# Patient Record
Sex: Male | Born: 1935 | Race: White | Hispanic: No | Marital: Married | Smoking: Former smoker
Health system: Southern US, Community
[De-identification: ages and names within clinical notes are randomized; demographics above are authoritative.]

## PROBLEM LIST (undated history)

## (undated) HISTORY — PX: PROSTATE SURGERY: SHX751

## (undated) HISTORY — PX: TONSILLECTOMY: SUR1361

## (undated) HISTORY — PX: INGUINAL HERNIA REPAIR: SUR1180

---

## 2014-05-27 DIAGNOSIS — R339 Retention of urine, unspecified: Secondary | ICD-10-CM | POA: Insufficient documentation

## 2014-05-27 DIAGNOSIS — N401 Enlarged prostate with lower urinary tract symptoms: Secondary | ICD-10-CM | POA: Insufficient documentation

## 2014-06-07 DIAGNOSIS — R339 Retention of urine, unspecified: Secondary | ICD-10-CM | POA: Insufficient documentation

## 2014-09-10 DIAGNOSIS — K4031 Unilateral inguinal hernia, with obstruction, without gangrene, recurrent: Secondary | ICD-10-CM | POA: Insufficient documentation

## 2016-03-30 DIAGNOSIS — H5203 Hypermetropia, bilateral: Secondary | ICD-10-CM | POA: Insufficient documentation

## 2016-03-30 DIAGNOSIS — H524 Presbyopia: Secondary | ICD-10-CM | POA: Insufficient documentation

## 2016-03-30 DIAGNOSIS — N400 Enlarged prostate without lower urinary tract symptoms: Secondary | ICD-10-CM | POA: Insufficient documentation

## 2016-03-30 DIAGNOSIS — K5900 Constipation, unspecified: Secondary | ICD-10-CM | POA: Insufficient documentation

## 2016-03-30 DIAGNOSIS — N3289 Other specified disorders of bladder: Secondary | ICD-10-CM | POA: Insufficient documentation

## 2016-03-30 DIAGNOSIS — M7020 Olecranon bursitis, unspecified elbow: Secondary | ICD-10-CM | POA: Insufficient documentation

## 2016-03-30 DIAGNOSIS — L719 Rosacea, unspecified: Secondary | ICD-10-CM | POA: Insufficient documentation

## 2016-03-30 DIAGNOSIS — H538 Other visual disturbances: Secondary | ICD-10-CM | POA: Insufficient documentation

## 2017-02-03 DIAGNOSIS — H903 Sensorineural hearing loss, bilateral: Secondary | ICD-10-CM | POA: Insufficient documentation

## 2017-06-20 ENCOUNTER — Encounter: Payer: Self-pay | Admitting: Neurology

## 2017-07-13 ENCOUNTER — Encounter: Payer: Self-pay | Admitting: Neurology
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
## 2017-07-18 ENCOUNTER — Ambulatory Visit (INDEPENDENT_AMBULATORY_CARE_PROVIDER_SITE_OTHER): Payer: Medicare Other | Admitting: Neurology

## 2017-07-18 ENCOUNTER — Encounter: Payer: Self-pay | Admitting: Neurology

## 2017-07-18 ENCOUNTER — Encounter: Payer: Self-pay | Admitting: Psychology

## 2017-07-18 VITALS — BP 108/70 | HR 60 | Ht 70.0 in | Wt 157.0 lb

## 2017-07-18 DIAGNOSIS — G20 Parkinson's disease: Secondary | ICD-10-CM

## 2017-07-18 DIAGNOSIS — K117 Disturbances of salivary secretion: Secondary | ICD-10-CM

## 2017-07-18 MED ORDER — CARBIDOPA-LEVODOPA 25-100 MG PO TABS: 1.0000 | ORAL_TABLET | Freq: Three times a day (TID) | ORAL | 3 refills | Status: DC

## 2017-07-18 NOTE — Patient Instructions (Addendum)
1. Start Carbidopa Levodopa as follows:  Take 1/2 tablet three times daily, at least 30 minutes before meals, for one week  Then take 1/2 tablet in the morning, 1/2 tablet in the afternoon, 1 tablet in the evening, at least 30 minutes before meals, for one week  Then take 1/2 tablet in the morning, 1 tablet in the afternoon, 1 tablet in the evening, at least 30 minutes before meals, for one week  Then take 1 tablet three times daily, at least 30 minutes before meals  2. Ruthe Mannanalia Aron, MD can be contacted at [phone].

## 2017-07-18 NOTE — Progress Notes (Signed)
I met with Mr. and Mrs., Ian Mcknight while they were in the office today. We reviewed the Parkinson's resources. He has been a cyclist in the past so the Parkinson cycle at [employer] appeals to him for exercise. I will research medication assistance programs since he does not have Medicare D. I reached out to [facility] to see about a medication assistance program. In addition, I am more than happy to research good RX for the patient as needed and e-mail the coupon to [email or web address].

## 2017-09-21 ENCOUNTER — Telehealth: Payer: Self-pay | Admitting: Psychology

## 2017-09-21 NOTE — Telephone Encounter (Signed)
Telephone call from the patient today to make sure that he is still getting his medication at the cheapest pharmacy.  [facility] good Rx [facility].  I encouraged him to attend the power for Parkinson's support groups as it looks like he and his wife would both enjoy and benefit connecting with other people in similar situations.

## 2017-11-03 NOTE — Progress Notes (Signed)
Ian Mcknight was seen today in the movement disorders clinic for neurologic consultation at the request of Adela GlimpseGold, Catherine Leigh.  This patient is accompanied in the office by his spouse who supplements the history.   The consultation is for the evaluation of cognitive change and dysarthria.  The records that were made available to me were reviewed.  Wife concerned about possible PD.    11/04/17 update: Patient is seen today, accompanied by his wife who supplements the history.  He was diagnosed with Parkinson's disease last visit and started on carbidopa/levodopa 25/100 and work to 1 tablet 3 times per day. Pt thinks that it has been helpful.  [facility] [facility] and "I am thrilled with it."  One fall and he just slipped on mud and fell.  He is drooling much less.  Patient was seen by urology at the beginning of November.  I have reviewed those records.  Cialis was prescribed at that visit for erectile dysfunction. C/o urinary frequency/nocturia of 3-5 times per night.  He is using the urinal.  He is using an OTC bladder supplement.    PREVIOUS MEDICATIONS: none to date  Allergies  Allergen Reactions  . Penicillins   . Sulfa Antibiotics   . Viagra [Sildenafil Citrate]     Current Outpatient Medications on File Prior to Visit  Medication Sig Dispense Refill  . carbidopa-levodopa (SINEMET IR) 25-100 MG tablet Take 1 tablet 3 (three) times daily by mouth. 90 tablet 3  . UNABLE TO FIND Omega Q  Bladder health     No current facility-administered medications on file prior to visit.    History reviewed. No pertinent past medical history.   Past Surgical History:  Procedure Laterality Date  . INGUINAL HERNIA REPAIR Right   . PROSTATE SURGERY    . TONSILLECTOMY      Social History   Socioeconomic History  . Marital status: Married    Spouse name: None  . Number of children: None  . Years of education: None  . Highest education level: None  Social Needs  .  Financial resource strain: None  . Food insecurity - worry: None  . Food insecurity - inability: None  . Transportation needs - medical: None  . Transportation needs - non-medical: None  Occupational History  . Occupation: retired    Comment: builder/printer  Tobacco Use  . Smoking status: Former Smoker    Last attempt to quit: 07/18/1969    Years since quitting: 48.3  . Smokeless tobacco: Never Used  . Tobacco comment: light smoker  Substance and Sexual Activity  . Alcohol use: No    Frequency: Never  . Drug use: No  . Sexual activity: None  Other Topics Concern  . None  Social History Narrative  . None   Family History  Problem Relation Age of Onset  . Dementia Mother   . Angina Father   . Breast cancer Sister   . Heart disease Sister   . Cancer Sister   . Other Child        1 with mental retardation/seizures    ROS:  A complete 10 system review of systems was obtained and was unremarkable apart from what is mentioned above.  PHYSICAL EXAMINATION:    VITALS:   Vitals:   11/04/17 1044  BP: 104/64  Pulse: 62  SpO2: 93%    GEN:  The patient appears stated age and is in NAD. HEENT:  Normocephalic, atraumatic.  The mucous membranes are moist. The superficial temporal arteries are without ropiness or tenderness.  He has copious drooling CV:  RRR Lungs:  CTAB Neck/HEME:  There are no carotid bruits bilaterally.  Neurological examination:  Orientation:  Montreal Cognitive Assessment  07/18/2017  Visuospatial/ Executive (0/5) 4  Naming (0/3) 3  Attention: Read list of digits (0/2) 2  Attention: Read list of letters (0/1) 1  Attention: Serial 7 subtraction starting at 100 (0/3) 3  Language: Repeat phrase (0/2) 2  Language : Fluency (0/1) 0  Abstraction (0/2) 2  Delayed Recall (0/5) 3  Orientation (0/6) 6  Total 26  Adjusted Score (based on education) 26   Cranial nerves: There is good facial symmetry. There is facial hypomimia.   The visual fields are  full to confrontational testing. The speech is fluent and dysarthric. Soft palate rises symmetrically and there is no tongue deviation. Hearing is intact to conversational tone. Sensation: Sensation is intact to light and pinprick throughout (facial, trunk, extremities). Vibration is intact at the bilateral big toe. There is no extinction with double simultaneous stimulation. There is no sensory dermatomal level identified. Motor: Strength is 5/5 in the bilateral upper and lower extremities.   Shoulder shrug is equal and symmetric.  There is no pronator drift.  Movement examination: Tone: There is mild increased tone in the bilateral upper extremities.  The tone in the lower extremities is normal.  Abnormal movements: none Coordination:  There is decremation only with  alternation of supination/pronation of the forearm bilaterally.  All other RAMs are good bilaterally Gait and Station: The patient has no difficulty getting out of the chair.  When asked to walk down the hall, he actually gets up and runs well down the hall.  He has mild decreased arm swing.  Labs: Labs have been reviewed from his primary care provider dated May 20, 2017.  Sodium was 137, potassium 4.4, chloride 101, CO2 30, BUN 19 and creatinine 0.98.  AST 19, ALT 14.  No TSH has been completed and records are available in care everywhere back to 2015.   ASSESSMENT/PLAN:  1. Idiopathic Parkinson's disease, diagnosed at the end of 2018.  -He is doing better on carbidopa/levodopa 25/100 three times per day.  Using good RX for RX coverage.  Risks, benefits, side effects and alternative therapies were discussed.  The opportunity to ask questions was given and they were answered to the best of my ability.  The patient expressed understanding and willingness to follow the outlined treatment protocols.  -Needs TSH today.  -asked many questions about disease progression and answered to best of my ability  2.  Sialorrhea  -This is  commonly associated with PD.  He thinks that this is better since starting carbidopa/levodopa.  3.  Nocturia  -needs an appointment with Dr. Lindley Magnus.  Consider myrbetriq.  Will make him an appointment  3.  Follow up is anticipated in the next few months, sooner should new neurologic issues arise.  Much greater than 50% of this visit was spent in counseling and coordinating care.  Total face to face time:  25 min   Cc:  Adela Glimpse, PA-C

## 2017-11-04 ENCOUNTER — Other Ambulatory Visit (INDEPENDENT_AMBULATORY_CARE_PROVIDER_SITE_OTHER): Payer: Medicare Other

## 2017-11-04 ENCOUNTER — Encounter: Payer: Self-pay | Admitting: Neurology

## 2017-11-04 ENCOUNTER — Ambulatory Visit (INDEPENDENT_AMBULATORY_CARE_PROVIDER_SITE_OTHER): Payer: Medicare Other | Admitting: Neurology

## 2017-11-04 VITALS — BP 104/64 | HR 62 | Ht 70.0 in | Wt 165.0 lb

## 2017-11-04 DIAGNOSIS — G20 Parkinson's disease: Secondary | ICD-10-CM

## 2017-11-04 DIAGNOSIS — R351 Nocturia: Secondary | ICD-10-CM

## 2017-11-04 DIAGNOSIS — K117 Disturbances of salivary secretion: Secondary | ICD-10-CM

## 2017-11-04 DIAGNOSIS — G20A1 Parkinson's disease without dyskinesia, without mention of fluctuations: Secondary | ICD-10-CM

## 2017-11-04 LAB — TSH: TSH: 8.5 u[IU]/mL — ABNORMAL HIGH (ref 0.35–4.50)

## 2017-11-04 MED ORDER — CARBIDOPA-LEVODOPA 25-100 MG PO TABS: 1.0000 | ORAL_TABLET | Freq: Three times a day (TID) | ORAL | 1 refills | Status: DC

## 2017-11-04 NOTE — Addendum Note (Signed)
Addended byMauricio Po: MCCRACKEN, Lesly RubensteinJADE L on: 11/04/2017 12:00 PM   Modules accepted: Orders

## 2017-11-07 ENCOUNTER — Telehealth: Payer: Self-pay | Admitting: Neurology

## 2017-11-07 NOTE — Telephone Encounter (Signed)
Left message on machine for patient to call back.

## 2017-11-07 NOTE — Telephone Encounter (Signed)
[place]re[place] and has access to thyroid lab. Dr. Arbutus Leasat [place] Childes- FYI.

## 2017-11-07 NOTE — Telephone Encounter (Signed)
Pt returned your call.  

## 2017-11-07 NOTE — Telephone Encounter (Signed)
-----   Message from Octaviano Battyebecca S Tat, DO sent at 11/04/2017  4:40 PM EST ----- Let pt know that it appears he has hypothyroidism.  Needs to f/u with PCP.

## 2017-11-07 NOTE — Telephone Encounter (Signed)
Spoke with patient's wife and they were made aware.

## 2017-11-07 NOTE — Telephone Encounter (Signed)
Pt's spouse called and wanted Dr Tat to know pt has am appointment with Dr Farris HasKramer tomorrow for thyroid

## 2017-11-08 ENCOUNTER — Ambulatory Visit (INDEPENDENT_AMBULATORY_CARE_PROVIDER_SITE_OTHER): Payer: Medicare Other | Admitting: Family Medicine

## 2017-11-08 ENCOUNTER — Encounter: Payer: Self-pay | Admitting: Family Medicine

## 2017-11-08 VITALS — BP 104/60 | HR 61 | Ht 70.0 in | Wt 167.1 lb

## 2017-11-08 DIAGNOSIS — I38 Endocarditis, valve unspecified: Secondary | ICD-10-CM

## 2017-11-08 DIAGNOSIS — G20A1 Parkinson's disease without dyskinesia, without mention of fluctuations: Secondary | ICD-10-CM | POA: Insufficient documentation

## 2017-11-08 DIAGNOSIS — E039 Hypothyroidism, unspecified: Secondary | ICD-10-CM

## 2017-11-08 DIAGNOSIS — G20 Parkinson's disease: Secondary | ICD-10-CM

## 2017-11-08 DIAGNOSIS — I493 Ventricular premature depolarization: Secondary | ICD-10-CM

## 2017-11-08 DIAGNOSIS — Z0000 Encounter for general adult medical examination without abnormal findings: Secondary | ICD-10-CM | POA: Insufficient documentation

## 2017-11-08 LAB — COMPREHENSIVE METABOLIC PANEL
ALK PHOS: 67 U/L (ref 39–117)
ALT: 7 U/L (ref 0–53)
AST: 19 U/L (ref 0–37)
Albumin: 4.1 g/dL (ref 3.5–5.2)
BILIRUBIN TOTAL: 0.6 mg/dL (ref 0.2–1.2)
BUN: 19 mg/dL (ref 6–23)
CO2: 32 meq/L (ref 19–32)
CREATININE: 0.99 mg/dL (ref 0.40–1.50)
Calcium: 9.7 mg/dL (ref 8.4–10.5)
Chloride: 101 mEq/L (ref 96–112)
GFR: 76.95 mL/min (ref >60.00)
GLUCOSE: 110 mg/dL — AB (ref 70–99)
Potassium: 4.7 mEq/L (ref 3.5–5.1)
Sodium: 139 mEq/L (ref 135–145)
TOTAL PROTEIN: 6.9 g/dL (ref 6.0–8.3)

## 2017-11-08 LAB — URINALYSIS, ROUTINE W REFLEX MICROSCOPIC
Bilirubin Urine: NEGATIVE
Leukocytes, UA: NEGATIVE
NITRITE: NEGATIVE
Specific Gravity, Urine: 1.025 (ref 1.000–1.030)
TOTAL PROTEIN, URINE-UPE24: NEGATIVE
Urine Glucose: NEGATIVE
Urobilinogen, UA: 0.2 (ref 0.0–1.0)
WBC, UA: NONE SEEN (ref 0–?)
pH: 5.5 (ref 5.0–8.0)

## 2017-11-08 LAB — CBC
HCT: 41.3 % (ref 39.0–52.0)
Hemoglobin: 14.2 g/dL (ref 13.0–17.0)
MCHC: 34.4 g/dL (ref 30.0–36.0)
MCV: 95.3 fl (ref 78.0–100.0)
Platelets: 215 10*3/uL (ref 150.0–400.0)
RBC: 4.33 Mil/uL (ref 4.22–5.81)
RDW: 12.3 % (ref 11.5–15.5)
WBC: 4 10*3/uL (ref 4.0–10.5)

## 2017-11-08 LAB — LIPID PANEL
CHOLESTEROL: 125 mg/dL (ref 0–200)
HDL: 54.1 mg/dL (ref >39.00)
LDL Cholesterol: 62 mg/dL (ref 0–99)
NONHDL: 70.97
TRIGLYCERIDES: 47 mg/dL (ref 0.0–149.0)
Total CHOL/HDL Ratio: 2
VLDL: 9.4 mg/dL (ref 0.0–40.0)

## 2017-11-08 LAB — TSH: TSH: 5.26 u[IU]/mL — ABNORMAL HIGH (ref 0.35–4.50)

## 2017-11-08 MED ORDER — LEVOTHYROXINE SODIUM 25 MCG PO CAPS: 1.0000 | ORAL_CAPSULE | Freq: Every day | ORAL | 1 refills | Status: DC

## 2017-11-08 NOTE — Patient Instructions (Addendum)
Hypothyroidism Hypothyroidism is a disorder of the thyroid. The thyroid is a large gland that is located in the lower front of the neck. The thyroid releases hormones that control how the body works. With hypothyroidism, the thyroid does not make enough of these hormones. What are the causes? Causes of hypothyroidism may include:  Viral infections.  Pregnancy.  Your own defense system (immune system) attacking your thyroid.  Certain medicines.  Birth defects.  Past radiation treatments to your head or neck.  Past treatment with radioactive iodine.  Past surgical removal of part or all of your thyroid.  Problems with the gland that is located in the center of your brain (pituitary).  What are the signs or symptoms? Signs and symptoms of hypothyroidism may include:  Feeling as though you have no energy (lethargy).  Inability to tolerate cold.  Weight gain that is not explained by a change in diet or exercise habits.  Dry skin.  Coarse hair.  Menstrual irregularity.  Slowing of thought processes.  Constipation.  Sadness or depression.  How is this diagnosed? Your health care provider may diagnose hypothyroidism with blood tests and ultrasound tests. How is this treated? Hypothyroidism is treated with medicine that replaces the hormones that your body does not make. After you begin treatment, it may take several weeks for symptoms to go away. Follow these instructions at home:  Take medicines only as directed by your health care provider.  If you start taking any new medicines, tell your health care provider.  Keep all follow-up visits as directed by your health care provider. This is important. As your condition improves, your dosage needs may change. You will need to have blood tests regularly so that your health care provider can watch your condition. Contact a health care provider if:  Your symptoms do not get better with treatment.  You are taking thyroid  replacement medicine and: ? You sweat excessively. ? You have tremors. ? You feel anxious. ? You lose weight rapidly. ? You cannot tolerate heat. ? You have emotional swings. ? You have diarrhea. ? You feel weak. Get help right away if:  You develop chest pain.  You develop an irregular heartbeat.  You develop a rapid heartbeat. This information is not intended to replace advice given to you by your health care provider. Make sure you discuss any questions you have with your health care provider. Document Released: 08/23/2005 Document Revised: 01/29/2016 Document Reviewed: 01/08/2014 Elsevier Interactive Patient Education  2018 [place].  Premature Ventricular Contraction A premature ventricular contraction (PVC) is a common irregularity in the normal heart rhythm. These contractions are extra heartbeats that start in the heart ventricles and occur too early in the normal sequence. During the PVC, the heart's normal electrical pathway is not used, so the beat is shorter and less effective. In most cases, these contractions come and go and do not require treatment. What are the causes? In many cases, the cause may not be known. Common causes of the condition include:  Smoking.  Drinking alcohol.  Caffeine.  Certain medicines.  Some illegal drugs.  Stress.  Certain medical conditions can also cause PVCs:  Changes in minerals in the blood (electrolytes).  Heart failure.  Heart valve problems.  Low blood oxygen levels or high carbon dioxide levels.  Heart attack, or coronary artery disease.  What are the signs or symptoms? The main symptom of this condition is a fast or skipped heartbeat (palpitations). Other symptoms include:  Chest pain.  Shortness  of breath.  Feeling tired.  Dizziness.  In some cases, there are no symptoms. How is this diagnosed? This condition may be diagnosed based on:  Your medical history.  A physical exam. During the exam,  the health care provider will check for irregular heartbeats.  Tests, such as: ? An ECG (electrocardiogram) to monitor the electrical activity of your heart. ? Holter monitor testing. This involves wearing a device that clips to your clothing and monitors the electrical activity of your heart over longer periods of time. ? Stress tests to see how exercise affects your heart rhythm and blood supply. ? Echocardiogram. This test uses sound waves (ultrasound) to produce an image of your heart. ? Electrophysiology study. This test checks the electric pathways in your heart.  How is this treated? Treatment depends on any underlying conditions, the type of PVCs that you are having, and how much the symptoms are interfering with your daily life. Possible treatments include:  Avoiding things that can trigger the premature contractions, such as caffeine or alcohol.  Medicines. These may be given if symptoms are severe or if the extra heartbeats are frequent.  Treatment for any underlying condition that is found to be the cause of the contractions.  Catheter ablation. This procedure destroys the heart tissues that send abnormal signals.  In some cases, no treatment is required. Follow these instructions at home: Lifestyle Follow these instructions as told by your health care provider:  Do not use any products that contain nicotine or tobacco, such as cigarettes and e-cigarettes. If you need help quitting, ask your health care provider.  If caffeine triggers episodes of PVC, do not eat, drink, or use anything with caffeine in it.  If caffeine does not seem to trigger episodes, consume caffeine in moderation.  If alcohol triggers episodes of PVC, do not drink alcohol.  If alcohol does not seem to trigger episodes, limit alcohol intake to no more than 1 drink a day for nonpregnant women and 2 drinks a day for men. One drink equals 12 oz of beer, 5 oz of wine, or 1 oz of hard liquor.  Exercise  regularly. Ask your health care provider what type of exercise is safe for you.  Find healthy ways to manage stress. Avoid stressful situations when possible.  Try to get at least 7-9 hours of sleep each night, or as much as recommended by your health care provider.  Do not use illegal drugs.  General instructions  Take over-the-counter and prescription medicines only as told by your health care provider.  Keep all follow-up visits as told by your health care provider. This is important. Get help right away if:  You feel palpitations that are frequent or continual.  You have chest pain.  You have shortness of breath.  You have sweating for no reason.  You have nausea and vomiting.  You become light-headed or you faint. This information is not intended to replace advice given to you by your health care provider. Make sure you discuss any questions you have with your health care provider. Document Released: 04/09/2004 Document Revised: 04/16/2016 Document Reviewed: 01/28/2016 Elsevier Interactive Patient Education  2018 [place] 65 Years and Older, Male Preventive care refers to lifestyle choices and visits with your health care provider that can promote health and wellness. What does preventive care include?  A yearly physical exam. This is also called an annual well check.  Dental exams once or twice a year.  Routine eye exams.  Ask your health care provider how often you should have your eyes checked.  Personal lifestyle choices, including: ? Daily care of your teeth and gums. ? Regular physical activity. ? Eating a healthy diet. ? Avoiding tobacco and drug use. ? Limiting alcohol use. ? Practicing safe sex. ? Taking low doses of aspirin every day. ? Taking vitamin and mineral supplements as recommended by your health care provider. What happens during an annual well check? The services and screenings done by your health care provider during  your annual well check will depend on your age, overall health, lifestyle risk factors, and family history of disease. Counseling Your health care provider may ask you questions about your:  Alcohol use.  Tobacco use.  Drug use.  Emotional well-being.  Home and relationship well-being.  Sexual activity.  Eating habits.  History of falls.  Memory and ability to understand (cognition).  Work and work Statistician.  Screening You may have the following tests or measurements:  Height, weight, and BMI.  Blood pressure.  Lipid and cholesterol levels. These may be checked every 5 years, or more frequently if you are over 78 years old.  Skin check.  Lung cancer screening. You may have this screening every year starting at age 54 if you have a 30-pack-year history of smoking and currently smoke or have quit within the past 15 years.  Fecal occult blood test (FOBT) of the stool. You may have this test every year starting at age 48.  Flexible sigmoidoscopy or colonoscopy. You may have a sigmoidoscopy every 5 years or a colonoscopy every 10 years starting at age 54.  Prostate cancer screening. Recommendations will vary depending on your family history and other risks.  Hepatitis C blood test.  Hepatitis B blood test.  Sexually transmitted disease (STD) testing.  Diabetes screening. This is done by checking your blood sugar (glucose) after you have not eaten for a while (fasting). You may have this done every 1-3 years.  Abdominal aortic aneurysm (AAA) screening. You may need this if you are a current or former smoker.  Osteoporosis. You may be screened starting at age 31 if you are at high risk.  Talk with your health care provider about your test results, treatment options, and if necessary, the need for more tests. Vaccines Your health care provider may recommend certain vaccines, such as:  Influenza vaccine. This is recommended every year.  Tetanus, diphtheria, and  acellular pertussis (Tdap, Td) vaccine. You may need a Td booster every 10 years.  Varicella vaccine. You may need this if you have not been vaccinated.  Zoster vaccine. You may need this after age 80.  Measles, mumps, and rubella (MMR) vaccine. You may need at least one dose of MMR if you were born in 1957 or later. You may also need a second dose.  Pneumococcal 13-valent conjugate (PCV13) vaccine. One dose is recommended after age 13.  Pneumococcal polysaccharide (PPSV23) vaccine. One dose is recommended after age 65.  Meningococcal vaccine. You may need this if you have certain conditions.  Hepatitis A vaccine. You may need this if you have certain conditions or if you travel or work in places where you may be exposed to hepatitis A.  Hepatitis B vaccine. You may need this if you have certain conditions or if you travel or work in places where you may be exposed to hepatitis B.  Haemophilus influenzae type b (Hib) vaccine. You may need this if you have certain risk factors.  Talk to  your health care provider about which screenings and vaccines you need and how often you need them. This information is not intended to replace advice given to you by your health care provider. Make sure you discuss any questions you have with your health care provider. Document Released: 09/19/2015 Document Revised: 05/12/2016 Document Reviewed: 06/24/2015 Elsevier Interactive Patient Education  2018 [employer]. Levothyroxine oral capsules What is this medicine? LEVOTHYROXINE (lee voe thye ROX een) is a thyroid hormone. This medicine can improve symptoms of thyroid deficiency such as slow speech, lack of energy, weight gain, hair loss, dry skin, and feeling cold. It also helps to treat goiter (an enlarged thyroid gland). It is also used to treat some kinds of thyroid cancer along with surgery and other medicines. This medicine may be used for other purposes; ask your health care provider or pharmacist if  you have questions. COMMON BRAND NAME(S): Tirosint What should I tell my health care provider before I take this medicine? They need to know if you have any of these conditions: -angina -blood clotting problems -diabetes -dieting or on a weight loss program -fertility problems -heart disease -high levels of thyroid hormone -pituitary gland problem -previous heart attack -an unusual or allergic reaction to levothyroxine, thyroid hormones, other medicines, foods, dyes, or preservatives -pregnant or trying to get pregnant -breast-feeding How should I use this medicine? Take this medicine by mouth with a glass of water. It is best to take on an empty stomach, at least 30 minutes to one hour before breakfast. Avoid taking antacids containing aluminum or magnesium, simethicone, bile acid sequestrants, calcium carbonate, sodium polystyrene sulfonate, ferrous sulfate, and sucralfate within 4 hours of taking this medicine. Do not cut, crush or chew this medicine. Follow the directions on the prescription label. Take at the same time each day. Do not take your medicine more often than directed. Talk to your pediatrician regarding the use of this medicine in children. While this drug may be prescribed for selected conditions, precautions do apply. Since the capsules cannot be crushed or placed in water, they may only be given to infants and children who are able to swallow an intact capsule. Overdosage: If you think you have taken too much of this medicine contact a poison control center or emergency room at once. NOTE: This medicine is only for you. Do not share this medicine with others. What if I miss a dose? If you miss a dose, take it as soon as you can. If it is almost time for your next dose, take only that dose. Do not take double or extra doses. What may interact with this medicine? -amiodarone -antacids -anti-thyroid medicines -calcium  supplements -carbamazepine -cholestyramine -colestipol -digoxin -male hormones, including contraceptive or birth control pills -iron supplements -ketamine -liquid nutrition products like Ensure -medicines for colds and breathing difficulties -medicines for diabetes -medicines for mental depression -medicines or herbals used to decrease weight or appetite -phenobarbital or other barbiturate medications -phenytoin -prednisone or other corticosteroids -rifabutin -rifampin -simethicone -sodium polystyrene sulfonate -soy isoflavones -sucralfate -theophylline -warfarin This list may not describe all possible interactions. Give your health care provider a list of all the medicines, herbs, non-prescription drugs, or dietary supplements you use. Also tell them if you smoke, drink alcohol, or use illegal drugs. Some items may interact with your medicine. What should I watch for while using this medicine? Do not switch brands of this medicine unless your health care professional agrees with the change. Ask questions if you are uncertain. You will  need regular exams and occasional blood tests to check the response to treatment. If you are receiving this medicine for an underactive thyroid, it may be several weeks before you notice an improvement. Check with your doctor or health care professional if your symptoms do not improve. It may be necessary for you to take this medicine for the rest of your life. Do not stop using this medicine unless your doctor or health care professional advises you to. This medicine can affect blood sugar levels. If you have diabetes, check your blood sugar as directed. You may lose some of your hair when you first start treatment. With time, this usually corrects itself. If you are going to have surgery, tell your doctor or health care professional that you are taking this medicine. What side effects may I notice from receiving this medicine? Side effects that you  should report to your doctor or health care professional as soon as possible: -allergic reactions like skin rash, itching or hives, swelling of the face, lips, or tongue -chest pain -excessive sweating or intolerance to heat -fast or irregular heartbeat -nervousness -swelling of ankles, feet, or legs -tremors Side effects that usually do not require medical attention (report to your doctor or health care professional if they continue or are bothersome): -changes in appetite -changes in menstrual periods -diarrhea -hair loss -headache -trouble sleeping -weight loss This list may not describe all possible side effects. Call your doctor for medical advice about side effects. You may report side effects to FDA at 1-800-FDA-1088. Where should I keep my medicine? Keep out of the reach of children. Store at room temperature between 15 and 30 degrees C (59 and 86 degrees F). Protect from light and moisture. Keep container tightly closed. Throw away any unused medicine after the expiration date. NOTE: This sheet is a summary. It may not cover all possible information. If you have questions about this medicine, talk to your doctor, pharmacist, or health care provider.  2018 Elsevier/Gold Standard (2015-09-25 09:44:55)

## 2017-11-08 NOTE — Progress Notes (Signed)
Subjective:  Patient ID: Ian Mcknight, male    DOB: 10/03/1933  Age: 84 y.o. MRN: [id number]  CC: Establish Care   HPI Ian Mcknight presents for establishment of care and for follow-up of an elevated TSH of by his neurologist.  He is currently being treated for Parkinson's with Sinemet.  He was diagnosed in November and started the Sinemet at that time.  His neurologist measured her TSH and found to be elevated.  He has some problems with constipation but denies cold intolerance hair loss or decreased energy levels.  If anything he has trouble gaining weight.  He is currently dealing with BPH symptoms that have been treated with laser ablation.  Symptoms  worse somewhat improved but has now worsened with decreased urine flow and straining to urinate.  He is planning on following up with his he has no chest pain or shortness of breath.  Urologist in the near future.  He has no known history of heart arrhythmia or murmurs in his heart.  He is never had a heart attack to his knowledge.  He continues to be active.  [place] [employer] [place] and walks almost daily.  He lives with his wife.  They have 3 grown children.  His daughter is mentally challenged and living in the [place] area on her own.  He does not smoke drink alcohol or use illicit drugs.  History Ian Mcknight has no past medical history on file.   He has a past surgical history that includes Tonsillectomy; Prostate surgery; and Inguinal hernia repair (Right).   His family history includes Angina in his father; Breast cancer in his sister; Cancer in his sister; Dementia in his mother; Heart disease in his sister; Other in his child.He reports that he quit smoking about 48 years ago. he has never used smokeless tobacco. He reports that he does not drink alcohol or use drugs.  Outpatient Medications Prior to Visit  Medication Sig Dispense Refill  . carbidopa-levodopa (SINEMET IR) 25-100 MG tablet Take 1 tablet by mouth 3 (three) times daily. 270  tablet 1  . UNABLE TO FIND Omega Q  Bladder health     No facility-administered medications prior to visit.     ROS Review of Systems  Constitutional: Negative.  Negative for diaphoresis, fatigue, fever and unexpected weight change.  HENT: Negative.   Eyes: Negative.   Respiratory: Negative for shortness of breath.   Cardiovascular: Negative for chest pain and palpitations.  Gastrointestinal: Negative.   Endocrine: Negative for cold intolerance and heat intolerance.  Genitourinary: Positive for difficulty urinating.  Skin: Negative for pallor and wound.  Allergic/Immunologic: Negative for immunocompromised state.  Neurological: Positive for tremors.  Hematological: Does not bruise/bleed easily.  Psychiatric/Behavioral: Negative.     Objective:  BP 104/60 (BP Location: Left Arm, Patient Position: Sitting, Cuff Size: Normal)   Pulse 61   Ht 5\' 10"  (1.778 m)   Wt 167 lb 2 oz (75.8 kg)   SpO2 99%   BMI 23.98 kg/m   Physical Exam  Constitutional: He is oriented to person, place, and time. He appears well-developed and well-nourished. No distress.  HENT:  Head: Normocephalic and atraumatic.  Right Ear: External ear normal.  Left Ear: External ear normal.  Mouth/Throat: Oropharynx is clear and moist. No oropharyngeal exudate.  Eyes: Pupils are equal, round, and reactive to light. Right eye exhibits no discharge. Left eye exhibits no discharge. No scleral icterus.  Neck: Neck supple. No JVD present. No tracheal  deviation present. No thyromegaly present.  Cardiovascular: Normal rate. Frequent extrasystoles are present.  Murmur heard.  Systolic murmur is present with a grade of 2/6.  Diastolic murmur is present with a grade of 1/6. Pulmonary/Chest: Effort normal and breath sounds normal. No stridor.  Abdominal: Bowel sounds are normal.  Lymphadenopathy:    He has no cervical adenopathy.  Neurological: He is alert and oriented to person, place, and time.  Skin: Skin is warm  and dry. He is not diaphoretic.  Psychiatric: He has a normal mood and affect. His behavior is normal.      Assessment & Plan:   Ian Mcknight was seen today for establish care.  Diagnoses and all orders for this visit:  Parkinson's disease (HCC)  Hypothyroidism, unspecified type -     TSH -     Lipid panel -     Levothyroxine Sodium 25 MCG CAPS; Take 1 capsule (25 mcg total) by mouth daily before breakfast. One hour before eating  Murmur, diastolic -     TSH -     Lipid panel -     Ambulatory referral to Cardiology  Healthcare maintenance -     CBC -     Comprehensive metabolic panel -     Urinalysis, Routine w reflex microscopic  PVC (premature ventricular contraction) -     CBC -     Comprehensive metabolic panel -     TSH -     Urinalysis, Routine w reflex microscopic -     EKG 12-Lead -     Ambulatory referral to Cardiology   I am having Ian Mcknight start on Levothyroxine Sodium. I am also having him maintain his UNABLE TO FIND and carbidopa-levodopa.  Meds ordered this encounter  Medications  . Levothyroxine Sodium 25 MCG CAPS    Sig: Take 1 capsule (25 mcg total) by mouth daily before breakfast. One hour before eating    Dispense:  30 capsule    Refill:  1   Stressed the importance of taking medicine daily and hour before eating.  Follow-up: Return in about 8 weeks (around 01/03/2018).  Mliss Sax, MD

## 2017-11-09 ENCOUNTER — Other Ambulatory Visit: Payer: Self-pay

## 2017-11-09 DIAGNOSIS — E039 Hypothyroidism, unspecified: Secondary | ICD-10-CM

## 2017-11-09 MED ORDER — LEVOTHYROXINE SODIUM 25 MCG PO CAPS: 1.0000 | ORAL_CAPSULE | Freq: Every day | ORAL | 1 refills | Status: DC

## 2017-11-10 ENCOUNTER — Other Ambulatory Visit: Payer: Self-pay

## 2017-11-10 MED ORDER — LEVOTHYROXINE SODIUM 25 MCG PO TABS: 25.0000 ug | ORAL_TABLET | Freq: Every day | ORAL | 1 refills | Status: DC

## 2017-11-24 ENCOUNTER — Ambulatory Visit (INDEPENDENT_AMBULATORY_CARE_PROVIDER_SITE_OTHER): Payer: Medicare Other | Admitting: Cardiovascular Disease

## 2017-11-24 ENCOUNTER — Encounter: Payer: Self-pay | Admitting: Cardiovascular Disease

## 2017-11-24 VITALS — BP 100/70 | HR 75 | Ht 70.0 in | Wt 168.0 lb

## 2017-11-24 DIAGNOSIS — R011 Cardiac murmur, unspecified: Secondary | ICD-10-CM | POA: Insufficient documentation

## 2017-11-24 NOTE — Progress Notes (Signed)
11/24/2017 Ian Carrowvey Streety   12/09/1933  [id number]  Primary Physician Mliss SaxKremer, William Alfred, MD Primary Cardiologist: Runell GessJonathan J Johney Perotti MD Nicholes CalamityFACP, FACC, FAHA, [place]  HPI:  Ian Mcknight is a 84 y.o. thin-appearing married Caucasian male father of 3 children, grandfather of 2 grandchildren who is accompanied by his wife Ian Mcknight today. He was referred by Dr. Doreene BurkeKremer, his new PCP, because of that auscultated cardiac murmur. He does have a history of Parkinson's disease as well as PVCs. He has no other risk factors. He denies chest pain or shortness of breath. He has never had a heart attack or stroke. His father did have a myocardial infarction and sister 7 years younger than him had bypass surgery.    Current Meds  Medication Sig  . carbidopa-levodopa (SINEMET IR) 25-100 MG tablet Take 1 tablet by mouth 3 (three) times daily.  Marland Kitchen. levothyroxine (SYNTHROID, LEVOTHROID) 25 MCG tablet Take 1 tablet (25 mcg total) by mouth daily before breakfast.  . UNABLE TO FIND Omega Q  Bladder health     Allergies  Allergen Reactions  . Penicillins   . Sulfa Antibiotics   . Viagra [Sildenafil Citrate]     Social History   Socioeconomic History  . Marital status: Married    Spouse name: Not on file  . Number of children: Not on file  . Years of education: Not on file  . Highest education level: Not on file  Occupational History  . Occupation: retired    Comment: Print production plannerbuilder/printer  Social Needs  . Financial resource strain: Not on file  . Food insecurity:    Worry: Not on file    Inability: Not on file  . Transportation needs:    Medical: Not on file    Non-medical: Not on file  Tobacco Use  . Smoking status: Former Smoker    Last attempt to quit: 07/18/1969    Years since quitting: 48.3  . Smokeless tobacco: Never Used  . Tobacco comment: light smoker  Substance and Sexual Activity  . Alcohol use: No    Frequency: Never  . Drug use: No  . Sexual activity: Not on file  Lifestyle  . Physical  activity:    Days per week: Not on file    Minutes per session: Not on file  . Stress: Not on file  Relationships  . Social connections:    Talks on phone: Not on file    Gets together: Not on file    Attends religious service: Not on file    Active member of club or organization: Not on file    Attends meetings of clubs or organizations: Not on file    Relationship status: Not on file  . Intimate partner violence:    Fear of current or ex partner: Not on file    Emotionally abused: Not on file    Physically abused: Not on file    Forced sexual activity: Not on file  Other Topics Concern  . Not on file  Social History Narrative  . Not on file     Review of Systems: General: negative for chills, fever, night sweats or weight changes.  Cardiovascular: negative for chest pain, dyspnea on exertion, edema, orthopnea, palpitations, paroxysmal nocturnal dyspnea or shortness of breath Dermatological: negative for rash Respiratory: negative for cough or wheezing Urologic: negative for hematuria Abdominal: negative for nausea, vomiting, diarrhea, bright red blood per rectum, melena, or hematemesis Neurologic: negative for visual changes, syncope, or dizziness All other systems reviewed  and are otherwise negative except as noted above.    Blood pressure 100/70, pulse 75, height 5\' 10"  (1.778 m), weight 168 lb (76.2 kg).  General appearance: alert and no distress Neck: no adenopathy, no carotid bruit, no JVD, supple, symmetrical, trachea midline and thyroid not enlarged, symmetric, no tenderness/mass/nodules Lungs: clear to auscultation bilaterally Heart: soft apical systolic murmur consistent with mitral regurgitation. Extremities: extremities normal, atraumatic, no cyanosis or edema Pulses: 2+ and symmetric Skin: Skin color, texture, turgor normal. No rashes or lesions Neurologic: Alert and oriented X 3, normal strength and tone. Normal symmetric reflexes. Normal coordination and  gait  EKG sinus rhythm at 75 with sinus arrhythmia and PACs with evidence of LVH. I personally reviewed this EKG.  ASSESSMENT AND PLAN:   Cardiac murmur Mr. Mcknight was referred by Dr. Doreene Burke because of auscultated cardiac murmur. On exam he has a soft apical systolic murmur consistent with mitral regurgitation. I'm going to get a 2-D echo to further evaluate.      Runell Gess MD FACP,FACC,FAHA, [facility] 11/24/2017 11:35 AM

## 2017-11-24 NOTE — Assessment & Plan Note (Signed)
[name] was referred by Dr. Doreene BurkeKremer because of auscultated cardiac murmur. On exam he has a soft apical systolic murmur consistent with mitral regurgitation. I'm going to get a 2-D echo to further evaluate.

## 2017-11-24 NOTE — Patient Instructions (Signed)

## 2017-11-29 ENCOUNTER — Ambulatory Visit: Payer: PRIVATE HEALTH INSURANCE | Admitting: Physician Assistant

## 2017-11-30 ENCOUNTER — Ambulatory Visit: Payer: Medicare Other | Admitting: Cardiovascular Disease

## 2017-12-01 ENCOUNTER — Other Ambulatory Visit: Payer: Self-pay

## 2017-12-01 ENCOUNTER — Ambulatory Visit (HOSPITAL_COMMUNITY): Payer: Medicare Other | Attending: Cardiology

## 2017-12-01 DIAGNOSIS — Z87891 Personal history of nicotine dependence: Secondary | ICD-10-CM | POA: Insufficient documentation

## 2017-12-01 DIAGNOSIS — Z8249 Family history of ischemic heart disease and other diseases of the circulatory system: Secondary | ICD-10-CM | POA: Diagnosis not present

## 2017-12-01 DIAGNOSIS — I083 Combined rheumatic disorders of mitral, aortic and tricuspid valves: Secondary | ICD-10-CM | POA: Diagnosis not present

## 2017-12-01 DIAGNOSIS — R011 Cardiac murmur, unspecified: Secondary | ICD-10-CM | POA: Diagnosis present

## 2017-12-09 ENCOUNTER — Other Ambulatory Visit: Payer: Self-pay

## 2017-12-09 MED ORDER — LEVOTHYROXINE SODIUM 25 MCG PO TABS: 25.0000 ug | ORAL_TABLET | Freq: Every day | ORAL | 1 refills | Status: DC

## 2018-01-03 ENCOUNTER — Ambulatory Visit (INDEPENDENT_AMBULATORY_CARE_PROVIDER_SITE_OTHER): Payer: Medicare Other | Admitting: Family Medicine

## 2018-01-03 ENCOUNTER — Encounter: Payer: Self-pay | Admitting: Family Medicine

## 2018-01-03 VITALS — BP 128/78 | HR 85 | Ht 70.0 in | Wt 165.2 lb

## 2018-01-03 DIAGNOSIS — E039 Hypothyroidism, unspecified: Secondary | ICD-10-CM | POA: Diagnosis not present

## 2018-01-03 NOTE — Progress Notes (Addendum)
Subjective:  Patient ID: Ian Mcknight, male    DOB: [birth year]  Age: 84 y.o. MRN: [id number]  CC: Follow-up   HPI Ewell Benassi presents for follow-up of his hypothyroidism.  He has been taking his Synthroid as directed.  On occasion it does bother his stomach somewhat taking it an hour before his morning meal.  He was concerned about taking it at the same time as his Sinemet I said that would be okay to do it should not be a problem with that.  I was over 45 minutes late getting in to see him and he had another appointment in 10 minutes and needed to leave.  I told him that I would order his TSH today and he can come back have it drawn at his convenience.  Outpatient Medications Prior to Visit  Medication Sig Dispense Refill  . carbidopa-levodopa (SINEMET IR) 25-100 MG tablet Take 1 tablet by mouth 3 (three) times daily. 270 tablet 1  . UNABLE TO FIND Omega Q  Bladder health    . levothyroxine (SYNTHROID, LEVOTHROID) 25 MCG tablet Take 1 tablet (25 mcg total) by mouth daily before breakfast. 90 tablet 1   No facility-administered medications prior to visit.     ROS Review of Systems  Constitutional: Negative.  Negative for fatigue and unexpected weight change.  Endocrine: Negative for cold intolerance and heat intolerance.  Skin: Negative.   Psychiatric/Behavioral: Negative.     Objective:  BP 128/78   Pulse 85   Ht  (1.778 m)   Wt 165 lb 4 oz (75 kg)   SpO2 99%   BMI 23.71 kg/m   BP Readings from Last 3 Encounters:  01/03/18 128/78  11/24/17 100/70  11/08/17 104/60    Wt Readings from Last 3 Encounters:  01/03/18 165 lb 4 oz (75 kg)  11/24/17 168 lb (76.2 kg)  11/08/17 167 lb 2 oz (75.8 kg)    Physical Exam  Constitutional: He is oriented to person, place, and time. He appears well-developed and well-nourished. No distress.  HENT:  Head: Normocephalic and atraumatic.  Right Ear: External ear normal.  Left Ear: External ear normal.  Eyes: Right eye exhibits no  discharge. Left eye exhibits no discharge. No scleral icterus.  Pulmonary/Chest: Effort normal.  Neurological: He is alert and oriented to person, place, and time.  Skin: He is not diaphoretic.  Psychiatric: He has a normal mood and affect. His behavior is normal.    Lab Results  Component Value Date   WBC 4.0 11/08/2017   HGB 14.2 11/08/2017   HCT 41.3 11/08/2017   PLT 215.0 11/08/2017   GLUCOSE 110 (H) 11/08/2017   CHOL 125 11/08/2017   TRIG 47.0 11/08/2017   HDL 54.10 11/08/2017   LDLCALC 62 11/08/2017   ALT 7 11/08/2017   AST 19 11/08/2017   NA 139 11/08/2017   K 4.7 11/08/2017   CL 101 11/08/2017   CREATININE 0.99 11/08/2017   BUN 19 11/08/2017   CO2 32 11/08/2017   TSH 5.80 (H) 01/04/2018    Patient was never admitted.  Assessment & Plan:   Marv was seen today for follow-up.  Diagnoses and all orders for this visit:  Hypothyroidism, unspecified type -     TSH; Future -     Discontinue: levothyroxine (SYNTHROID, LEVOTHROID) 25 MCG tablet; Take 1 tablet (25 mcg total) by mouth daily before breakfast. -     levothyroxine (SYNTHROID, LEVOTHROID) 50 MCG tablet; Take 1 tablet (50 mcg total) by  mouth daily.   I have discontinued TRUE Kielty's levothyroxine and levothyroxine. I am also having him start on levothyroxine. Additionally, I am having him maintain his UNABLE TO FIND and carbidopa-levodopa.  Meds ordered this encounter  Medications  . DISCONTD: levothyroxine (SYNTHROID, LEVOTHROID) 25 MCG tablet    Sig: Take 1 tablet (25 mcg total) by mouth daily before breakfast.    Dispense:  90 tablet    Refill:  1  . levothyroxine (SYNTHROID, LEVOTHROID) 50 MCG tablet    Sig: Take 1 tablet (50 mcg total) by mouth daily.    Dispense:  90 tablet    Refill:  3   Follow-up: No follow-ups on file.  Mliss Sax, MD

## 2018-01-04 ENCOUNTER — Other Ambulatory Visit (INDEPENDENT_AMBULATORY_CARE_PROVIDER_SITE_OTHER): Payer: Medicare Other

## 2018-01-04 DIAGNOSIS — E039 Hypothyroidism, unspecified: Secondary | ICD-10-CM

## 2018-01-04 LAB — TSH: TSH: 5.8 u[IU]/mL — AB (ref 0.35–4.50)

## 2018-01-05 MED ORDER — LEVOTHYROXINE SODIUM 50 MCG PO TABS: 50.0000 ug | ORAL_TABLET | Freq: Every day | ORAL | 3 refills | Status: DC

## 2018-01-05 MED ORDER — LEVOTHYROXINE SODIUM 25 MCG PO TABS: 25.0000 ug | ORAL_TABLET | Freq: Every day | ORAL | 1 refills | Status: DC

## 2018-01-05 NOTE — Addendum Note (Signed)
Addended by: Andrez Grime on: 01/05/2018 10:32 AM   Modules accepted: Orders

## 2018-01-06 ENCOUNTER — Ambulatory Visit (INDEPENDENT_AMBULATORY_CARE_PROVIDER_SITE_OTHER): Payer: Medicare Other | Admitting: Family Medicine

## 2018-01-06 ENCOUNTER — Encounter: Payer: Self-pay | Admitting: Family Medicine

## 2018-01-06 VITALS — BP 128/78 | HR 64 | Temp 98.5°F | Ht 70.0 in | Wt 165.0 lb

## 2018-01-06 DIAGNOSIS — E039 Hypothyroidism, unspecified: Secondary | ICD-10-CM

## 2018-01-06 NOTE — Progress Notes (Signed)
Subjective:  Patient ID: Ian Mcknight, male    DOB: April 22, 1934  Age: 84 y.o. MRN: [id number]  CC: Thyroid Problem (discuss thyroid medication)   HPI Ian Mcknight presents for a discussion of why his TSH had increased under treatment for with Synthroid for hypothyroidism.  He reminds me that the pharmacy changed manufactures for his medicine.  He assures me that he has been taking it every morning on a fasting stomach 1 hour prior to eating.  He remains quite active and walks on his treadmill 3 times a week.  He denies cold sensitivity or hair loss.  He does have some issue with constipation that is responsive to an apple with a improvements.  Outpatient Medications Prior to Visit  Medication Sig Dispense Refill  . carbidopa-levodopa (SINEMET IR) 25-100 MG tablet Take 1 tablet by mouth 3 (three) times daily. 270 tablet 1  . UNABLE TO FIND Omega Q  Bladder health    . levothyroxine (SYNTHROID, LEVOTHROID) 50 MCG tablet Take 1 tablet (50 mcg total) by mouth daily. 90 tablet 3   No facility-administered medications prior to visit.     ROS Review of Systems  Constitutional: Negative for fatigue and unexpected weight change.  Gastrointestinal: Positive for constipation.  Endocrine: Negative for cold intolerance and heat intolerance.  Psychiatric/Behavioral: Negative.     Objective:  BP 128/78 (BP Location: Left Arm, Patient Position: Sitting, Cuff Size: Normal)   Pulse 64   Temp 98.5 F (36.9 C) (Oral)   Ht  (1.778 m)   Wt 165 lb (74.8 kg)   SpO2 96%   BMI 23.68 kg/m   BP Readings from Last 3 Encounters:  01/06/18 128/78  01/03/18 128/78  11/24/17 100/70    Wt Readings from Last 3 Encounters:  01/06/18 165 lb (74.8 kg)  01/03/18 165 lb 4 oz (75 kg)  11/24/17 168 lb (76.2 kg)    Physical Exam  Constitutional: He is oriented to person, place, and time. He appears well-developed and well-nourished. No distress.  HENT:  Head: Normocephalic and atraumatic.  Right Ear:  External ear normal.  Left Ear: External ear normal.  Eyes: Right eye exhibits no discharge. Left eye exhibits no discharge.  Pulmonary/Chest: Effort normal.  Neurological: He is alert and oriented to person, place, and time.  Skin: He is not diaphoretic.  Psychiatric: He has a normal mood and affect. His behavior is normal.    Lab Results  Component Value Date   WBC 4.0 11/08/2017   HGB 14.2 11/08/2017   HCT 41.3 11/08/2017   PLT 215.0 11/08/2017   GLUCOSE 110 (H) 11/08/2017   CHOL 125 11/08/2017   TRIG 47.0 11/08/2017   HDL 54.10 11/08/2017   LDLCALC 62 11/08/2017   ALT 7 11/08/2017   AST 19 11/08/2017   NA 139 11/08/2017   K 4.7 11/08/2017   CL 101 11/08/2017   CREATININE 0.99 11/08/2017   BUN 19 11/08/2017   CO2 32 11/08/2017   TSH 5.80 (H) 01/04/2018    Patient was never admitted.  Assessment & Plan:   Ian Mcknight was seen today for thyroid problem.  Diagnoses and all orders for this visit:  Hypothyroidism, unspecified type   I have discontinued Ian Mcknight's levothyroxine. I am also having him maintain his UNABLE TO FIND and carbidopa-levodopa.  No orders of the defined types were placed in this encounter.  Patient would like to discontinue his thyroid medicine for another 6 weeks and recheck the TSH.  I told him that  we would have to start all over again with a low dose Synthroid and he understands.   Follow-up: Return in about 6 weeks (around 02/17/2018).  Mliss Sax, MD

## 2018-01-06 NOTE — Patient Instructions (Signed)
Hypothyroidism Hypothyroidism is a disorder of the thyroid. The thyroid is a large gland that is located in the lower front of the neck. The thyroid releases hormones that control how the body works. With hypothyroidism, the thyroid does not make enough of these hormones. What are the causes? Causes of hypothyroidism may include:  Viral infections.  Pregnancy.  Your own defense system (immune system) attacking your thyroid.  Certain medicines.  Birth defects.  Past radiation treatments to your head or neck.  Past treatment with radioactive iodine.  Past surgical removal of part or all of your thyroid.  Problems with the gland that is located in the center of your brain (pituitary).  What are the signs or symptoms? Signs and symptoms of hypothyroidism may include:  Feeling as though you have no energy (lethargy).  Inability to tolerate cold.  Weight gain that is not explained by a change in diet or exercise habits.  Dry skin.  Coarse hair.  Menstrual irregularity.  Slowing of thought processes.  Constipation.  Sadness or depression.  How is this diagnosed? Your health care provider may diagnose hypothyroidism with blood tests and ultrasound tests. How is this treated? Hypothyroidism is treated with medicine that replaces the hormones that your body does not make. After you begin treatment, it may take several weeks for symptoms to go away. Follow these instructions at home:  Take medicines only as directed by your health care provider.  If you start taking any new medicines, tell your health care provider.  Keep all follow-up visits as directed by your health care provider. This is important. As your condition improves, your dosage needs may change. You will need to have blood tests regularly so that your health care provider can watch your condition. Contact a health care provider if:  Your symptoms do not get better with treatment.  You are taking thyroid  replacement medicine and: ? You sweat excessively. ? You have tremors. ? You feel anxious. ? You lose weight rapidly. ? You cannot tolerate heat. ? You have emotional swings. ? You have diarrhea. ? You feel weak. Get help right away if:  You develop chest pain.  You develop an irregular heartbeat.  You develop a rapid heartbeat. This information is not intended to replace advice given to you by your health care provider. Make sure you discuss any questions you have with your health care provider. Document Released: 08/23/2005 Document Revised: 01/29/2016 Document Reviewed: 01/08/2014 Elsevier Interactive Patient Education  2018 Elsevier Inc.  

## 2018-02-17 ENCOUNTER — Ambulatory Visit (INDEPENDENT_AMBULATORY_CARE_PROVIDER_SITE_OTHER): Payer: Medicare Other | Admitting: Family Medicine

## 2018-02-17 ENCOUNTER — Encounter: Payer: Self-pay | Admitting: Family Medicine

## 2018-02-17 VITALS — BP 120/80 | HR 70 | Temp 98.1°F | Ht 70.0 in | Wt 165.0 lb

## 2018-02-17 DIAGNOSIS — T887XXA Unspecified adverse effect of drug or medicament, initial encounter: Secondary | ICD-10-CM | POA: Insufficient documentation

## 2018-02-17 DIAGNOSIS — E039 Hypothyroidism, unspecified: Secondary | ICD-10-CM | POA: Diagnosis not present

## 2018-02-17 LAB — TSH: TSH: 6.81 u[IU]/mL — ABNORMAL HIGH (ref 0.35–4.50)

## 2018-02-17 NOTE — Progress Notes (Addendum)
Subjective:  Patient ID: Ian Mcknight, male    DOB: [birth year]  Age: 84 y.o. MRN: [id number]  CC: Follow-up   HPI Ian Mcknight presents for follow-up status post temporary discontinuation of levothyroxine.  He felt as though taking the medicine each morning on a fasting stomach was causing stomach upset.  He says that he no longer has the stomach upset status post discontinuation of the medicine.  He does not feel like there is been a change in his energy levels.  He denies cold sensitivity or constipation.  He has been supplementing with an over the counter thyroid supplement.  He continues to exercise.  Outpatient Medications Prior to Visit  Medication Sig Dispense Refill  . carbidopa-levodopa (SINEMET IR) 25-100 MG tablet Take 1 tablet by mouth 3 (three) times daily. 270 tablet 1  . UNABLE TO FIND Omega Q  Bladder health     No facility-administered medications prior to visit.     ROS Review of Systems  Constitutional: Negative.  Negative for chills, fatigue and unexpected weight change.  Respiratory: Negative.   Cardiovascular: Negative.   Gastrointestinal: Negative.   Endocrine: Negative for cold intolerance and heat intolerance.  Neurological: Negative for weakness.  Psychiatric/Behavioral: Negative.     Objective:  BP 120/80   Pulse 70   Temp 98.1 F (36.7 C)   Ht 5\' 10"  (1.778 m)   Wt 165 lb (74.8 kg)   SpO2 97%   BMI 23.68 kg/m   BP Readings from Last 3 Encounters:  02/17/18 120/80  01/06/18 128/78  01/03/18 128/78    Wt Readings from Last 3 Encounters:  02/17/18 165 lb (74.8 kg)  01/06/18 165 lb (74.8 kg)  01/03/18 165 lb 4 oz (75 kg)    Physical Exam  Constitutional: He is oriented to person, place, and time. He appears well-developed and well-nourished. No distress.  HENT:  Head: Normocephalic and atraumatic.  Right Ear: External ear normal.  Left Ear: External ear normal.  Eyes: Right eye exhibits no discharge. Left eye exhibits no discharge. No  scleral icterus.  Pulmonary/Chest: Effort normal.  Neurological: He is alert and oriented to person, place, and time.  Skin: He is not diaphoretic.  Psychiatric: He has a normal mood and affect. His behavior is normal.    Lab Results  Component Value Date   WBC 4.0 11/08/2017   HGB 14.2 11/08/2017   HCT 41.3 11/08/2017   PLT 215.0 11/08/2017   GLUCOSE 110 (H) 11/08/2017   CHOL 125 11/08/2017   TRIG 47.0 11/08/2017   HDL 54.10 11/08/2017   LDLCALC 62 11/08/2017   ALT 7 11/08/2017   AST 19 11/08/2017   NA 139 11/08/2017   K 4.7 11/08/2017   CL 101 11/08/2017   CREATININE 0.99 11/08/2017   BUN 19 11/08/2017   CO2 32 11/08/2017   TSH 6.81 (H) 02/17/2018    Patient was never admitted.  Assessment & Plan:   Ian Mcknight was seen today for follow-up.  Diagnoses and all orders for this visit:  Hypothyroidism, unspecified type -     TSH -     Discontinue: levothyroxine (SYNTHROID, LEVOTHROID) 50 MCG tablet; Take one every morning on a fasting stomach one hour prior to eating. -     levothyroxine (SYNTHROID) 50 MCG tablet; Take 1 tablet (50 mcg total) by mouth daily before breakfast.  Medication side effect   I have discontinued Ian Mcknight's levothyroxine. I am also having him start on levothyroxine. Additionally, I am having him maintain  his UNABLE TO FIND and carbidopa-levodopa.  Meds ordered this encounter  Medications  . DISCONTD: levothyroxine (SYNTHROID, LEVOTHROID) 50 MCG tablet    Sig: Take one every morning on a fasting stomach one hour prior to eating.    Dispense:  90 tablet    Refill:  0  . levothyroxine (SYNTHROID) 50 MCG tablet    Sig: Take 1 tablet (50 mcg total) by mouth daily before breakfast.    Dispense:  90 tablet    Refill:  0   TSH level pends status post discontinuation of thyroid pill.  We will try to find an alternative to levothyroxine if we need to begin therapy again.  Follow-up: Return in about 2 months (around 04/19/2018), or follow up pends  results of blood work.Mliss Sax.  William Alfred Kremer, MD

## 2018-02-20 ENCOUNTER — Other Ambulatory Visit: Payer: Self-pay | Admitting: *Deleted

## 2018-02-20 DIAGNOSIS — E039 Hypothyroidism, unspecified: Secondary | ICD-10-CM

## 2018-02-20 MED ORDER — SYNTHROID 50 MCG PO TABS: 50.0000 ug | ORAL_TABLET | Freq: Every day | ORAL | 0 refills | Status: DC

## 2018-02-20 MED ORDER — LEVOTHYROXINE SODIUM 50 MCG PO TABS: ORAL_TABLET | ORAL | 0 refills | Status: DC

## 2018-02-20 MED ORDER — LEVOTHYROXINE SODIUM 50 MCG PO TABS: 50.0000 ug | ORAL_TABLET | Freq: Every day | ORAL | 0 refills | Status: DC

## 2018-02-20 NOTE — Addendum Note (Signed)
Addended by: Andrez GrimeKREMER, Kiela Shisler A on: 02/20/2018 09:43 AM   Modules accepted: Orders, Level of Service

## 2018-02-20 NOTE — Addendum Note (Signed)
Addended by: Marcell AngerSELF, Stevens Magwood E on: 02/20/2018 10:13 AM   Modules accepted: Orders

## 2018-02-21 ENCOUNTER — Other Ambulatory Visit: Payer: Self-pay

## 2018-02-21 DIAGNOSIS — E039 Hypothyroidism, unspecified: Secondary | ICD-10-CM

## 2018-02-21 MED ORDER — LEVOTHYROXINE SODIUM 50 MCG PO TABS: 50.0000 ug | ORAL_TABLET | Freq: Every day | ORAL | 0 refills | Status: DC

## 2018-03-10 ENCOUNTER — Ambulatory Visit (INDEPENDENT_AMBULATORY_CARE_PROVIDER_SITE_OTHER): Payer: Medicare Other | Admitting: Neurology

## 2018-03-10 ENCOUNTER — Encounter: Payer: Self-pay | Admitting: Neurology

## 2018-03-10 VITALS — BP 132/74 | HR 62 | Ht 70.0 in | Wt 167.0 lb

## 2018-03-10 DIAGNOSIS — G20 Parkinson's disease: Secondary | ICD-10-CM

## 2018-03-10 NOTE — Progress Notes (Signed)
Ian Mcknight was seen today in the movement disorders clinic for neurologic consultation at the request of Adela GlimpseGold, Catherine Leigh.  This patient is accompanied in the office by his spouse who supplements the history.   The consultation is for the evaluation of cognitive change and dysarthria.  [facility] were reviewed.  Wife concerned about possible PD.    11/04/17 update: Patient is seen today, accompanied by his wife who supplements the history.  He was diagnosed with Parkinson's disease last visit and started on carbidopa/levodopa 25/100 and work to 1 tablet 3 times per day. Pt thinks that it has been helpful.  [facility] is [facility] [facility] and "I am thrilled with it."  One fall and he just slipped on mud and fell.  He is drooling much less.  Patient was seen by urology at the beginning of November.  I have reviewed those records.  Cialis was prescribed at that visit for erectile dysfunction. C/o urinary frequency/nocturia of 3-5 times per night.  He is using the urinal.  He is using an OTC bladder supplement.    03/10/18 update: Patient is seen today in follow-up for Parkinson's disease.  This patient is accompanied in the office by his spouse who supplements the history.Patient is on carbidopa/levodopa 25/100, 1 tablet 3 times per day.  Pt denies falls.  Pt denies lightheadedness, near syncope.  No hallucinations.  Mood has been good.  Patient's thyroid function was off last visit.  He did follow-up with his primary care physician in that regard.  He was started on Synthroid.  He stopped it for a short period of time as he thought it was causing stomach upset, but restarted it last month after seeing his primary care physician.  He now states that he thinks that he thinks that the carbidopa/levodopa 25/100 sometimes causes stomach upset but only has issue in the AM.  He finds he does okay if stomach has food in it.  He asks me about his supplements but then  didn't bring the supplements he is taking.  No falls.  He is going to the [employer] cycle class for PD.    PREVIOUS MEDICATIONS: none to date  Allergies  Allergen Reactions  . Penicillins   . Sulfa Antibiotics   . Viagra [Sildenafil Citrate]     Current Outpatient Medications on File Prior to Visit  Medication Sig Dispense Refill  . carbidopa-levodopa (SINEMET IR) 25-100 MG tablet Take 1 tablet by mouth 3 (three) times daily. 270 tablet 1  . levothyroxine (SYNTHROID) 50 MCG tablet Take 1 tablet (50 mcg total) by mouth daily before breakfast. 90 tablet 0  . MULTIPLE VITAMIN PO Take by mouth.    Marland Kitchen. UNABLE TO FIND Omega Q  Bladder health     No current facility-administered medications on file prior to visit.    History reviewed. No pertinent past medical history.   Past Surgical History:  Procedure Laterality Date  . INGUINAL HERNIA REPAIR Right   . PROSTATE SURGERY    . TONSILLECTOMY      Social History   Socioeconomic History  . Marital status: Married    Spouse name: Not on file  . Number of children: Not on file  . Years of education: Not on file  . Highest education level: Not on file  Occupational History  . Occupation: retired    Comment: Print production plannerbuilder/printer  Social Needs  . Financial resource strain: Not on  file  . Food insecurity:    Worry: Not on file    Inability: Not on file  . Transportation needs:    Medical: Not on file    Non-medical: Not on file  Tobacco Use  . Smoking status: Former Smoker    Last attempt to quit: 07/18/1969    Years since quitting: 48.6  . Smokeless tobacco: Never Used  . Tobacco comment: light smoker  Substance and Sexual Activity  . Alcohol use: No    Frequency: Never  . Drug use: No  . Sexual activity: Not on file  Lifestyle  . Physical activity:    Days per week: Not on file    Minutes per session: Not on file  . Stress: Not on file  Relationships  . Social connections:    Talks on phone: Not on file    Gets  together: Not on file    Attends religious service: Not on file    Active member of club or organization: Not on file    Attends meetings of clubs or organizations: Not on file    Relationship status: Not on file  Other Topics Concern  . Not on file  Social History Narrative  . Not on file   Family History  Problem Relation Age of Onset  . Dementia Mother   . Angina Father   . Breast cancer Sister   . Heart disease Sister   . Cancer Sister   . Other Child        1 with mental retardation/seizures    ROS:  A complete 10 system review of systems was obtained and was unremarkable apart from what is mentioned above.  PHYSICAL EXAMINATION:    VITALS:   Vitals:   03/10/18 1039  BP: 132/74  Pulse: 62  SpO2: 94%    GEN:  The patient appears stated age and is in NAD. HEENT:  Normocephalic, atraumatic.  The mucous membranes are moist. The superficial temporal arteries are without ropiness or tenderness. CV:  RRR Lungs:  CTAB Neck/HEME:  There are no carotid bruits bilaterally.    Orientation: alert and oriented x 3 Montreal Cognitive Assessment  07/18/2017  Visuospatial/ Executive (0/5) 4  Naming (0/3) 3  Attention: Read list of digits (0/2) 2  Attention: Read list of letters (0/1) 1  Attention: Serial 7 subtraction starting at 100 (0/3) 3  Language: Repeat phrase (0/2) 2  Language : Fluency (0/1) 0  Abstraction (0/2) 2  Delayed Recall (0/5) 3  Orientation (0/6) 6  Total 26  Adjusted Score (based on education) 26     Neurological examination:  Orientation: The patient is alert and oriented x3. Cranial nerves: There is good facial symmetry. The speech is fluent and clear. Soft palate rises symmetrically and there is no tongue deviation. Hearing is intact to conversational tone. Sensation: Sensation is intact to light touch throughout Motor: Strength is 5/5 in the bilateral upper and lower extremities.   Shoulder shrug is equal and symmetric.  There is no pronator  drift.  Movement examination: Tone: There is mild increased tone in the RUE Abnormal movements: none Coordination:  There is mild decremation with RAM's, with  alternation of supination/pronation of the forearm bilaterally and with finger taps/hand opening and closing on the right. Gait and Station: The patient has no difficulty arising out of a deep-seated chair without the use of the hands. The patient's stride length is good and he has mild decreased arm swing.    Labs: Labs  have been reviewed from his primary care provider dated May 20, 2017.  Sodium was 137, potassium 4.4, chloride 101, CO2 30, BUN 19 and creatinine 0.98.  AST 19, ALT 14.  No TSH has been completed and records are available in care everywhere back to 2015.   ASSESSMENT/PLAN:  1. Idiopathic Parkinson's disease, diagnosed at the end of 2018.  -continue carbidopa/levodopa 25/100 tid.  Take with carbohydrate.  Risks, benefits, side effects and alternative therapies were discussed.  The opportunity to ask questions was given and they were answered to the best of my ability.  The patient expressed understanding and willingness to follow the outlined treatment protocols.  -to get me list of supplements  -discussed protein interaction with carbidopa/levodopa 25/100  -pt education provided  -safety discussed  -talked about association of PD with melanoma.  Has upcoming appt with dermatology in [place]  2.  Sialorrhea  -This is commonly associated with PD.  He thinks that this is better since starting carbidopa/levodopa.  3.  Nocturia  -seeing urology  4.  F/u 5 months.  Much greater than 50% of this visit was spent in counseling and coordinating care.  Total face to face time:  25 min   Cc:  Mliss Sax, MD

## 2018-03-10 NOTE — Patient Instructions (Signed)
  [facility]  The [facility] team know that living well with a movement disorder extends far beyond our clinic walls. We are together with you. Our team is passionate about providing resources to you and your loved ones who are living with Parkinson's disease and movement disorders. Participate in these programs and join our community. These resources are free or low cost!   [facility] is adding:   Innovative educational programs for patients and caregivers.   Support groups for patients and caregivers living with Parkinson's disease.   Parkinson's specific exercise programs.   Custom tailored therapeutic programs that will benefit patient's living with Parkinson's disease.   We are in this together. You can help and contribute to grow these programs and resources in our community. 100% of the funds donated to the Movement Disorders Fund stays right here in our community to support patients and their caregivers.  To make a tax deductible contribution:  -ask for a [facility] Parkinson's envelope in the office today.  - call the Office of Institutional Advancement at [phone].         

## 2018-03-23 ENCOUNTER — Other Ambulatory Visit: Payer: Self-pay | Admitting: Neurology

## 2018-05-11 ENCOUNTER — Encounter: Payer: Self-pay | Admitting: Family Medicine

## 2018-05-11 ENCOUNTER — Ambulatory Visit (INDEPENDENT_AMBULATORY_CARE_PROVIDER_SITE_OTHER): Payer: Medicare Other | Admitting: Family Medicine

## 2018-05-11 VITALS — BP 122/78 | HR 88 | Ht 70.0 in | Wt 166.2 lb

## 2018-05-11 DIAGNOSIS — R5383 Other fatigue: Secondary | ICD-10-CM

## 2018-05-11 DIAGNOSIS — R531 Weakness: Secondary | ICD-10-CM | POA: Insufficient documentation

## 2018-05-11 DIAGNOSIS — E039 Hypothyroidism, unspecified: Secondary | ICD-10-CM

## 2018-05-11 MED ORDER — B COMPLEX VITAMINS PO CAPS: 1.0000 | ORAL_CAPSULE | Freq: Every day | ORAL | 1 refills | Status: AC

## 2018-05-11 MED ORDER — B COMPLEX VITAMINS PO CAPS: 1.0000 | ORAL_CAPSULE | Freq: Every day | ORAL | 1 refills | Status: DC

## 2018-05-11 NOTE — Progress Notes (Addendum)
Subjective:  Patient ID: Ian Mcknight, male    DOB: 15-Oct-1933  Age: 84 y.o. MRN: [id number]  CC: Follow-up   HPI Breylon Sherrow presents for follow-up of his hypothyroidism.  He has been compliant with his medication taking it on a fasting stomach 1 hour before eating.  He has a sister takes a B12 shot and it helps her energy levels and wonders if I would be able to give him a injection as well.  Recent CBC shows normal hemoglobin levels.  He is on no medications that could affect his B12 levels.  Outpatient Medications Prior to Visit  Medication Sig Dispense Refill  . carbidopa-levodopa (SINEMET IR) 25-100 MG tablet TAKE ONE TABLET BY MOUTH THREE TIMES A DAY 270 tablet 1  . MULTIPLE VITAMIN PO Take by mouth.    Marland Kitchen UNABLE TO FIND Omega Q  Bladder health    . levothyroxine (SYNTHROID) 50 MCG tablet Take 1 tablet (50 mcg total) by mouth daily before breakfast. 90 tablet 0   No facility-administered medications prior to visit.     ROS Review of Systems  Constitutional: Negative.   HENT: Negative.   Eyes: Negative for photophobia and visual disturbance.  Respiratory: Negative.   Cardiovascular: Negative.   Gastrointestinal: Negative.   Hematological: Does not bruise/bleed easily.  Psychiatric/Behavioral: Negative.     Objective:  BP 122/78   Pulse 88   Ht 5\' 10"  (1.778 m)   Wt 166 lb 4 oz (75.4 kg)   SpO2 98%   BMI 23.85 kg/m   BP Readings from Last 3 Encounters:  05/11/18 122/78  03/10/18 132/74  02/17/18 120/80    Wt Readings from Last 3 Encounters:  05/11/18 166 lb 4 oz (75.4 kg)  03/10/18 167 lb (75.8 kg)  02/17/18 165 lb (74.8 kg)    Physical Exam  Constitutional: He is oriented to person, place, and time. He appears well-developed and well-nourished. No distress.  HENT:  Head: Normocephalic and atraumatic.  Right Ear: External ear normal.  Left Ear: External ear normal.  Eyes: Right eye exhibits no discharge. Left eye exhibits no discharge. No scleral icterus.    Pulmonary/Chest: Effort normal.  Neurological: He is alert and oriented to person, place, and time.  Skin: Skin is warm and dry. He is not diaphoretic.  Psychiatric: He has a normal mood and affect. His behavior is normal.    Lab Results  Component Value Date   WBC 4.0 11/08/2017   HGB 14.2 11/08/2017   HCT 41.3 11/08/2017   PLT 215.0 11/08/2017   GLUCOSE 110 (H) 11/08/2017   CHOL 125 11/08/2017   TRIG 47.0 11/08/2017   HDL 54.10 11/08/2017   LDLCALC 62 11/08/2017   ALT 7 11/08/2017   AST 19 11/08/2017   NA 139 11/08/2017   K 4.7 11/08/2017   CL 101 11/08/2017   CREATININE 0.99 11/08/2017   BUN 19 11/08/2017   CO2 32 11/08/2017   TSH 3.25 05/11/2018    Patient was never admitted.  Assessment & Plan:   Domonique was seen today for follow-up.  Diagnoses and all orders for this visit:  Hypothyroidism, unspecified type -     TSH -     levothyroxine (SYNTHROID) 50 MCG tablet; Take 1 tablet (50 mcg total) by mouth daily before breakfast.  Fatigue, unspecified type -     Discontinue: b complex vitamins capsule; Take 1 capsule by mouth daily. -     b complex vitamins capsule; Take 1 capsule by mouth daily.  I am having Verne Carrow maintain his UNABLE TO FIND, MULTIPLE VITAMIN PO, carbidopa-levodopa, b complex vitamins, and levothyroxine.  Meds ordered this encounter  Medications  . DISCONTD: b complex vitamins capsule    Sig: Take 1 capsule by mouth daily.    Dispense:  100 capsule    Refill:  1  . b complex vitamins capsule    Sig: Take 1 capsule by mouth daily.    Dispense:  100 capsule    Refill:  1  . levothyroxine (SYNTHROID) 50 MCG tablet    Sig: Take 1 tablet (50 mcg total) by mouth daily before breakfast.    Dispense:  90 tablet    Refill:  2    Please fill with same manufacturer every time, thanks!   Offered to check patient's B12 level but he refused to sign the waiver form for it.  Suggested that he try a B complex pill instead.  Follow-up: Return in  about 3 months (around 08/10/2018), or follow up pends results of blood work.  Mliss Sax, MD

## 2018-05-12 LAB — TSH: TSH: 3.25 u[IU]/mL (ref 0.35–4.50)

## 2018-05-15 MED ORDER — LEVOTHYROXINE SODIUM 50 MCG PO TABS: 50.0000 ug | ORAL_TABLET | Freq: Every day | ORAL | 2 refills | Status: DC

## 2018-05-15 NOTE — Addendum Note (Signed)
Addended by: Andrez Grime on: 05/15/2018 08:48 AM   Modules accepted: Orders

## 2018-06-07 DIAGNOSIS — H0100A Unspecified blepharitis right eye, upper and lower eyelids: Secondary | ICD-10-CM | POA: Insufficient documentation

## 2018-06-07 DIAGNOSIS — H11001 Unspecified pterygium of right eye: Secondary | ICD-10-CM | POA: Insufficient documentation

## 2018-07-05 ENCOUNTER — Other Ambulatory Visit: Payer: Medicare Other

## 2018-08-08 NOTE — Progress Notes (Signed)
Ian Mcknight was seen today in the movement disorders clinic for neurologic consultation at the request of Adela Glimpse.  This patient is accompanied in the office by his spouse who supplements the history.   The consultation is for the evaluation of cognitive change and dysarthria.  The records that were [facility] available to me were reviewed.  Wife concerned about possible PD.    11/04/17 update: Patient is seen today, accompanied by his wife who supplements the history.  He was diagnosed with Parkinson's disease last visit and started on carbidopa/levodopa 25/100 and work to 1 tablet 3 times per day. Pt thinks that it has been helpful.  He is going to the [facility] cycle class and "I am thrilled with it."  One fall and he just slipped on mud and fell.  He is drooling much less.  Patient was seen by urology at the beginning of November.  I have reviewed those records.  Cialis was prescribed at that visit for erectile dysfunction. C/o urinary frequency/nocturia of 3-5 times per night.  He is using the urinal.  He is using an OTC bladder supplement.    03/10/18 update: Patient is seen today in follow-up for Parkinson's disease.  This patient is accompanied in the office by his spouse who supplements the history.Patient is on carbidopa/levodopa 25/100, 1 tablet 3 times per day.  Pt denies falls.  Pt denies lightheadedness, near syncope.  No hallucinations.  Mood has been good.  Patient's thyroid function was off last visit.  He did follow-up with his primary care physician in that regard.  He was started on Synthroid.  He stopped it for a short period of time as he thought it was causing stomach upset, but restarted it last month after seeing his primary care physician.  He now states that he thinks that he thinks that the carbidopa/levodopa 25/100 sometimes causes stomach upset but only has issue in the AM.  He finds he does okay if stomach has food in it.  He asks me about his supplements but then  didn't bring the supplements he is taking.  No falls.  He is going to the [employer] cycle class for PD.    08/10/18 update: Patient is seen today in follow-up for Parkinson's disease, accompanied by his wife who supplements history.  Patient is on carbidopa/levodopa 25/100, 1 tablet 3 times per day.  He has had no falls since last visit.  No lightheadedness or near syncope.  No hallucinations.  Records were reviewed since our last visit, including his visit to primary care and his ophthalmology records.  Ophthalmology did recommend that he start artificial tears due to dry eyes in the setting of Parkinson's disease.  The drops are helping  PREVIOUS MEDICATIONS: none to date  Allergies  Allergen Reactions  . Penicillins   . Sulfa Antibiotics   . Viagra [Sildenafil Citrate]     Current Outpatient Medications on File Prior to Visit  Medication Sig Dispense Refill  . b complex vitamins capsule Take 1 capsule by mouth daily. 100 capsule 1  . carbidopa-levodopa (SINEMET IR) 25-100 MG tablet TAKE ONE TABLET BY MOUTH THREE TIMES A DAY 270 tablet 1  . levothyroxine (SYNTHROID) 50 MCG tablet Take 1 tablet (50 mcg total) by mouth daily before breakfast. 90 tablet 2  . MULTIPLE VITAMIN PO Take by mouth.    Marland Kitchen UNABLE TO FIND Omega Q  Bladder health     No current facility-administered medications on file prior to  visit.    History reviewed. No pertinent past medical history.   Past Surgical History:  Procedure Laterality Date  . INGUINAL HERNIA REPAIR Right   . PROSTATE SURGERY    . TONSILLECTOMY      Social History   Socioeconomic History  . Marital status: Married    Spouse name: Not on file  . Number of children: Not on file  . Years of education: Not on file  . Highest education level: Not on file  Occupational History  . Occupation: retired    Comment: Print production planner  Social Needs  . Financial resource strain: Not on file  . Food insecurity:    Worry: Not on file     Inability: Not on file  . Transportation needs:    Medical: Not on file    Non-medical: Not on file  Tobacco Use  . Smoking status: Former Smoker    Last attempt to quit: 07/18/1969    Years since quitting: 49.0  . Smokeless tobacco: Never Used  . Tobacco comment: light smoker  Substance and Sexual Activity  . Alcohol use: No    Frequency: Never  . Drug use: No  . Sexual activity: Not on file  Lifestyle  . Physical activity:    Days per week: Not on file    Minutes per session: Not on file  . Stress: Not on file  Relationships  . Social connections:    Talks on phone: Not on file    Gets together: Not on file    Attends religious service: Not on file    Active member of club or organization: Not on file    Attends meetings of clubs or organizations: Not on file    Relationship status: Not on file  Other Topics Concern  . Not on file  Social History Narrative  . Not on file   Family History  Problem Relation Age of Onset  . Dementia Mother   . Angina Father   . Breast cancer Sister   . Heart disease Sister   . Cancer Sister   . Other Child        1 with mental retardation/seizures    ROS:  Review of Systems  Constitutional: Positive for malaise/fatigue.  HENT: Negative.   Eyes: Negative.   Respiratory: Negative.   Cardiovascular: Negative.   Gastrointestinal: Negative.   Genitourinary: Positive for frequency and urgency.  Musculoskeletal: Negative.   Psychiatric/Behavioral: Negative.     PHYSICAL EXAMINATION:    VITALS:   Vitals:   08/10/18 1038  BP: 132/80  Pulse: 60  SpO2: 96%    GEN:  The patient appears stated age and is in NAD. HEENT:  Normocephalic, atraumatic.  The mucous membranes are moist. The superficial temporal arteries are without ropiness or tenderness. CV:  RRR Lungs:  CTAB Neck/HEME:  There are no carotid bruits bilaterally.    Orientation: alert and oriented x 3 Montreal Cognitive Assessment  07/18/2017  Visuospatial/  Executive (0/5) 4  Naming (0/3) 3  Attention: Read list of digits (0/2) 2  Attention: Read list of letters (0/1) 1  Attention: Serial 7 subtraction starting at 100 (0/3) 3  Language: Repeat phrase (0/2) 2  Language : Fluency (0/1) 0  Abstraction (0/2) 2  Delayed Recall (0/5) 3  Orientation (0/6) 6  Total 26  Adjusted Score (based on education) 26     Neurological examination:  Orientation: The patient is alert and oriented x3. Cranial nerves: There is good facial symmetry. The speech  is fluent and clear. Soft palate rises symmetrically and there is no tongue deviation. Hearing is intact to conversational tone. Sensation: Sensation is intact to light touch throughout Motor: Strength is 5/5 in the bilateral upper and lower extremities.   Shoulder shrug is equal and symmetric.  There is no pronator drift.  Movement examination: Tone: There is mild increased tone in the RUE (same as previous) Abnormal movements: none Coordination:  There is no decremation with any form of RAMS, including alternating supination and pronation of the forearm, hand opening and closing, finger taps, heel taps and toe taps Gait and Station: The patient has no difficulty arising out of a deep-seated chair without the use of the hands. The patient's stride length is good.  He runs very quickly down the hall (was not asked to do this, just it spontaneously).  Labs: Labs have been reviewed from his primary care provider dated May 20, 2017.  Sodium was 137, potassium 4.4, chloride 101, CO2 30, BUN 19 and creatinine 0.98.  AST 19, ALT 14.  No TSH has been completed and records are available in care everywhere back to 2015.   ASSESSMENT/PLAN:  1. Idiopathic Parkinson's disease, diagnosed at the end of 2018.  -continue carbidopa/levodopa 25/100 tid.  He is just slightly rigid in the right arm, but his goals were to get off of the medication.  We decided ultimately not to increase the medication yet, but I told  him that might be coming in the future.  He really feels pretty good right now.  -Talked about goal setting and reasonable goals.  -Talked about getting yearly appointment with dermatology.  2.  Sialorrhea  -This is commonly associated with PD.  He thinks that this is better since starting carbidopa/levodopa.  3.  Nocturia  -seeing urology  4.  Follow up is anticipated in the next few months, sooner should new neurologic issues arise.  Much greater than 50% of this visit was spent in counseling and coordinating care.  Total face to face time:  25 min   Cc:  Mliss SaxKremer, William Alfred, MD

## 2018-08-09 ENCOUNTER — Other Ambulatory Visit: Payer: Self-pay

## 2018-08-09 ENCOUNTER — Other Ambulatory Visit (INDEPENDENT_AMBULATORY_CARE_PROVIDER_SITE_OTHER): Payer: Medicare Other

## 2018-08-09 DIAGNOSIS — E039 Hypothyroidism, unspecified: Secondary | ICD-10-CM

## 2018-08-09 LAB — TSH: TSH: 2.53 u[IU]/mL (ref 0.35–4.50)

## 2018-08-10 ENCOUNTER — Encounter: Payer: Self-pay | Admitting: Neurology

## 2018-08-10 ENCOUNTER — Ambulatory Visit (INDEPENDENT_AMBULATORY_CARE_PROVIDER_SITE_OTHER): Payer: Medicare Other | Admitting: Neurology

## 2018-08-10 VITALS — BP 132/80 | HR 60 | Ht 70.0 in | Wt 171.0 lb

## 2018-08-10 DIAGNOSIS — G20 Parkinson's disease: Secondary | ICD-10-CM | POA: Diagnosis not present

## 2018-08-15 ENCOUNTER — Encounter: Payer: Self-pay | Admitting: Family Medicine

## 2018-08-15 ENCOUNTER — Ambulatory Visit (INDEPENDENT_AMBULATORY_CARE_PROVIDER_SITE_OTHER): Payer: Medicare Other | Admitting: Family Medicine

## 2018-08-15 VITALS — BP 124/70 | HR 63 | Ht 70.0 in | Wt 171.0 lb

## 2018-08-15 DIAGNOSIS — E039 Hypothyroidism, unspecified: Secondary | ICD-10-CM | POA: Diagnosis not present

## 2018-08-15 MED ORDER — LEVOTHYROXINE SODIUM 50 MCG PO TABS: 50.0000 ug | ORAL_TABLET | Freq: Every day | ORAL | 2 refills | Status: DC

## 2018-08-15 NOTE — Progress Notes (Signed)
Established Patient Office Visit  Subjective:  Patient ID: Ian Mcknight, male    DOB: May 26, 1934  Age: 84 y.o. MRN: [id number]  CC:  Chief Complaint  Patient presents with  . Follow-up    HPI Ian Mcknight presents for follow up on his hypothyroidism. His blood work came back within the normal limits. He has been taking his medication as directed.   History reviewed. No pertinent past medical history.  Past Surgical History:  Procedure Laterality Date  . INGUINAL HERNIA REPAIR Right   . PROSTATE SURGERY    . TONSILLECTOMY      Family History  Problem Relation Age of Onset  . Dementia Mother   . Angina Father   . Breast cancer Sister   . Heart disease Sister   . Cancer Sister   . Other Child        1 with mental retardation/seizures    Social History   Socioeconomic History  . Marital status: Married    Spouse name: Not on file  . Number of children: Not on file  . Years of education: Not on file  . Highest education level: Not on file  Occupational History  . Occupation: retired    Comment: Print production planner  Social Needs  . Financial resource strain: Not on file  . Food insecurity:    Worry: Not on file    Inability: Not on file  . Transportation needs:    Medical: Not on file    Non-medical: Not on file  Tobacco Use  . Smoking status: Former Smoker    Last attempt to quit: 07/18/1969    Years since quitting: 49.1  . Smokeless tobacco: Never Used  . Tobacco comment: light smoker  Substance and Sexual Activity  . Alcohol use: No    Frequency: Never  . Drug use: No  . Sexual activity: Not on file  Lifestyle  . Physical activity:    Days per week: Not on file    Minutes per session: Not on file  . Stress: Not on file  Relationships  . Social connections:    Talks on phone: Not on file    Gets together: Not on file    Attends religious service: Not on file    Active member of club or organization: Not on file    Attends meetings of clubs or  organizations: Not on file    Relationship status: Not on file  . Intimate partner violence:    Fear of current or ex partner: Not on file    Emotionally abused: Not on file    Physically abused: Not on file    Forced sexual activity: Not on file  Other Topics Concern  . Not on file  Social History Narrative  . Not on file    Outpatient Medications Prior to Visit  Medication Sig Dispense Refill  . b complex vitamins capsule Take 1 capsule by mouth daily. 100 capsule 1  . carbidopa-levodopa (SINEMET IR) 25-100 MG tablet TAKE ONE TABLET BY MOUTH THREE TIMES A DAY 270 tablet 1  . MULTIPLE VITAMIN PO Take by mouth.    Marland Kitchen UNABLE TO FIND Omega Q  Bladder health    . levothyroxine (SYNTHROID) 50 MCG tablet Take 1 tablet (50 mcg total) by mouth daily before breakfast. 90 tablet 2   No facility-administered medications prior to visit.     Allergies  Allergen Reactions  . Penicillins   . Sulfa Antibiotics   . Viagra [Sildenafil Citrate]  ROS Review of Systems  Constitutional: Negative.   HENT: Negative.   Respiratory: Negative.   Cardiovascular: Negative.   Gastrointestinal: Negative.   Endocrine: Negative for cold intolerance and heat intolerance.      Objective:    Physical Exam  Constitutional: He is oriented to person, place, and time. He appears well-developed and well-nourished. No distress.  HENT:  Head: Normocephalic.  Left Ear: External ear normal.  Eyes: Right eye exhibits no discharge. Left eye exhibits no discharge. No scleral icterus.  Neck: No JVD present. No tracheal deviation present.  Pulmonary/Chest: Effort normal. No stridor.  Neurological: He is alert and oriented to person, place, and time.  Skin: Skin is warm and dry. He is not diaphoretic.  Psychiatric: He has a normal mood and affect. His behavior is normal.    BP 124/70   Pulse 63   Ht 5\' 10"  (1.778 m)   Wt 171 lb (77.6 kg)   SpO2 97%   BMI 24.54 kg/m  Wt Readings from Last 3  Encounters:  08/15/18 171 lb (77.6 kg)  08/10/18 171 lb (77.6 kg)  05/11/18 166 lb 4 oz (75.4 kg)   BP Readings from Last 3 Encounters:  08/15/18 124/70  08/10/18 132/80  05/11/18 122/78   Health Maintenance Due  Topic Date Due  . TETANUS/TDAP  01/10/1955  . PNA vac Low Risk Adult (1 of 2 - PCV13) 01/09/2001  . INFLUENZA VACCINE  04/06/2018    There are no preventive care reminders to display for this patient.  Lab Results  Component Value Date   TSH 2.53 08/09/2018   Lab Results  Component Value Date   WBC 4.0 11/08/2017   HGB 14.2 11/08/2017   HCT 41.3 11/08/2017   MCV 95.3 11/08/2017   PLT 215.0 11/08/2017   Lab Results  Component Value Date   NA 139 11/08/2017   K 4.7 11/08/2017   CO2 32 11/08/2017   GLUCOSE 110 (H) 11/08/2017   BUN 19 11/08/2017   CREATININE 0.99 11/08/2017   BILITOT 0.6 11/08/2017   ALKPHOS 67 11/08/2017   AST 19 11/08/2017   ALT 7 11/08/2017   PROT 6.9 11/08/2017   ALBUMIN 4.1 11/08/2017   CALCIUM 9.7 11/08/2017   GFR 76.95 11/08/2017   Lab Results  Component Value Date   CHOL 125 11/08/2017   Lab Results  Component Value Date   HDL 54.10 11/08/2017   Lab Results  Component Value Date   LDLCALC 62 11/08/2017   Lab Results  Component Value Date   TRIG 47.0 11/08/2017   Lab Results  Component Value Date   CHOLHDL 2 11/08/2017   No results found for: HGBA1C    Assessment & Plan:   Problem List Items Addressed This Visit      Endocrine   Hypothyroidism - Primary   Relevant Medications   levothyroxine (SYNTHROID) 50 MCG tablet      Meds ordered this encounter  Medications  . levothyroxine (SYNTHROID) 50 MCG tablet    Sig: Take 1 tablet (50 mcg total) by mouth daily before breakfast.    Dispense:  90 tablet    Refill:  2    Please fill with same manufacturer every time, thanks!    Follow-up: Return in about 3 months (around 11/14/2018).

## 2018-10-05 ENCOUNTER — Other Ambulatory Visit: Payer: Self-pay | Admitting: Neurology

## 2018-11-14 ENCOUNTER — Encounter: Payer: Self-pay | Admitting: Family Medicine

## 2018-11-14 ENCOUNTER — Ambulatory Visit (INDEPENDENT_AMBULATORY_CARE_PROVIDER_SITE_OTHER): Payer: Medicare Other | Admitting: Family Medicine

## 2018-11-14 VITALS — BP 120/74 | HR 88 | Ht 70.0 in | Wt 171.0 lb

## 2018-11-14 DIAGNOSIS — K5901 Slow transit constipation: Secondary | ICD-10-CM | POA: Diagnosis not present

## 2018-11-14 DIAGNOSIS — E039 Hypothyroidism, unspecified: Secondary | ICD-10-CM

## 2018-11-14 LAB — BASIC METABOLIC PANEL
BUN: 21 mg/dL (ref 6–23)
CHLORIDE: 104 meq/L (ref 96–112)
CO2: 30 mEq/L (ref 19–32)
Calcium: 9.5 mg/dL (ref 8.4–10.5)
Creatinine, Ser: 1.17 mg/dL (ref 0.40–1.50)
GFR: 59.56 mL/min — AB (ref >60.00)
Glucose, Bld: 102 mg/dL — ABNORMAL HIGH (ref 70–99)
POTASSIUM: 4.9 meq/L (ref 3.5–5.1)
SODIUM: 139 meq/L (ref 135–145)

## 2018-11-14 LAB — URINALYSIS, ROUTINE W REFLEX MICROSCOPIC
Bilirubin Urine: NEGATIVE
Ketones, ur: NEGATIVE
Leukocytes,Ua: NEGATIVE
Nitrite: NEGATIVE
Specific Gravity, Urine: 1.02 (ref 1.000–1.030)
Total Protein, Urine: NEGATIVE
Urine Glucose: NEGATIVE
Urobilinogen, UA: 0.2 (ref 0.0–1.0)
pH: 6 (ref 5.0–8.0)

## 2018-11-14 LAB — CBC
HCT: 42.4 % (ref 39.0–52.0)
Hemoglobin: 14.6 g/dL (ref 13.0–17.0)
MCHC: 34.5 g/dL (ref 30.0–36.0)
MCV: 93.9 fl (ref 78.0–100.0)
Platelets: 210 10*3/uL (ref 150.0–400.0)
RBC: 4.51 Mil/uL (ref 4.22–5.81)
RDW: 12 % (ref 11.5–15.5)
WBC: 3.9 10*3/uL — ABNORMAL LOW (ref 4.0–10.5)

## 2018-11-14 LAB — TSH: TSH: 3.01 u[IU]/mL (ref 0.35–4.50)

## 2018-11-14 MED ORDER — DOCUSATE SODIUM 100 MG PO CAPS: 100.0000 mg | ORAL_CAPSULE | Freq: Two times a day (BID) | ORAL | 4 refills | Status: AC

## 2018-11-14 NOTE — Patient Instructions (Signed)

## 2018-11-14 NOTE — Progress Notes (Signed)
Established Patient Office Visit  Subjective:  Patient ID: Ian Mcknight, male    DOB: [birth year]  Age: 85 y.o. MRN: [id number]  CC:  Chief Complaint  Patient presents with  . Follow-up    HPI Ian Mcknight presents for follow-up of his hypothyroidism.  Assures me that he is taking his pill each morning on a fasting stomach.  He is having some problem with hard stooling.  He is eating an apple a day and uses prunes intermittently.  He is using a colon cleanse in the evening.  He is active by working around the house and out in the yard.  His wife is there at the house with him and she continues to work as a Catering manager.  He feels as though the Parkinson's is reasonably controlled with the Sinemet.  History reviewed. No pertinent past medical history.  Past Surgical History:  Procedure Laterality Date  . INGUINAL HERNIA REPAIR Right   . PROSTATE SURGERY    . TONSILLECTOMY      Family History  Problem Relation Age of Onset  . Dementia Mother   . Angina Father   . Breast cancer Sister   . Heart disease Sister   . Cancer Sister   . Other Child        1 with mental retardation/seizures    Social History   Socioeconomic History  . Marital status: Married    Spouse name: Not on file  . Number of children: Not on file  . Years of education: Not on file  . Highest education level: Not on file  Occupational History  . Occupation: retired    Comment: Print production planner  Social Needs  . Financial resource strain: Not on file  . Food insecurity:    Worry: Not on file    Inability: Not on file  . Transportation needs:    Medical: Not on file    Non-medical: Not on file  Tobacco Use  . Smoking status: Former Smoker    Last attempt to quit: 07/18/1969    Years since quitting: 49.3  . Smokeless tobacco: Never Used  . Tobacco comment: light smoker  Substance and Sexual Activity  . Alcohol use: No    Frequency: Never  . Drug use: No  . Sexual activity: Not on file  Lifestyle  .  Physical activity:    Days per week: Not on file    Minutes per session: Not on file  . Stress: Not on file  Relationships  . Social connections:    Talks on phone: Not on file    Gets together: Not on file    Attends religious service: Not on file    Active member of club or organization: Not on file    Attends meetings of clubs or organizations: Not on file    Relationship status: Not on file  . Intimate partner violence:    Fear of current or ex partner: Not on file    Emotionally abused: Not on file    Physically abused: Not on file    Forced sexual activity: Not on file  Other Topics Concern  . Not on file  Social History Narrative  . Not on file    Outpatient Medications Prior to Visit  Medication Sig Dispense Refill  . b complex vitamins capsule Take 1 capsule by mouth daily. 100 capsule 1  . carbidopa-levodopa (SINEMET IR) 25-100 MG tablet TAKE ONE TABLET BY MOUTH THREE TIMES A DAY 270 tablet 1  . levothyroxine (  SYNTHROID) 50 MCG tablet Take 1 tablet (50 mcg total) by mouth daily before breakfast. 90 tablet 2  . MULTIPLE VITAMIN PO Take by mouth.    Marland Kitchen UNABLE TO FIND Omega Q  Bladder health     No facility-administered medications prior to visit.     Allergies  Allergen Reactions  . Penicillins   . Sulfa Antibiotics   . Viagra [Sildenafil Citrate]     ROS Review of Systems  Constitutional: Negative for chills, diaphoresis, fatigue, fever and unexpected weight change.  HENT: Negative.   Eyes: Negative for photophobia and visual disturbance.  Respiratory: Negative.   Cardiovascular: Negative.   Gastrointestinal: Positive for constipation. Negative for abdominal pain, nausea and vomiting.  Endocrine: Negative for polyphagia and polyuria.  Genitourinary: Negative.   Musculoskeletal: Negative for arthralgias and myalgias.  Skin: Negative for pallor.  Allergic/Immunologic: Negative for immunocompromised state.  Neurological: Positive for tremors. Negative for  dizziness.  Hematological: Does not bruise/bleed easily.  Psychiatric/Behavioral: Negative.       Objective:    Physical Exam  Constitutional: He is oriented to person, place, and time. He appears well-developed and well-nourished. No distress.  HENT:  Head: Normocephalic and atraumatic.  Right Ear: External ear normal.  Left Ear: External ear normal.  Mouth/Throat: Oropharynx is clear and moist. No oropharyngeal exudate.  Eyes: Pupils are equal, round, and reactive to light. Conjunctivae are normal. Right eye exhibits no discharge. Left eye exhibits no discharge. No scleral icterus.  Neck: Neck supple. No JVD present. No tracheal deviation present. No thyromegaly present.  Cardiovascular: Normal rate, regular rhythm and normal heart sounds.  Pulmonary/Chest: Effort normal and breath sounds normal. No stridor.  Abdominal: Bowel sounds are normal.  Musculoskeletal:        General: No edema.  Lymphadenopathy:    He has no cervical adenopathy.  Neurological: He is alert and oriented to person, place, and time. He has normal strength. He displays tremor (small resting tremor. ).  Skin: Skin is warm and dry. He is not diaphoretic.  Psychiatric: He has a normal mood and affect. His behavior is normal.    BP 120/74   Pulse 88   Ht 5\' 10"  (1.778 m)   Wt 171 lb (77.6 kg)   SpO2 97%   BMI 24.54 kg/m  Wt Readings from Last 3 Encounters:  11/14/18 171 lb (77.6 kg)  08/15/18 171 lb (77.6 kg)  08/10/18 171 lb (77.6 kg)   BP Readings from Last 3 Encounters:  11/14/18 120/74  08/15/18 124/70  08/10/18 132/80   Guideline developer:  UpToDate (see UpToDate for funding source) Date Released: June 2014  Health Maintenance Due  Topic Date Due  . Ian Mcknight  01/10/1955  . PNA vac Low Risk Adult (1 of 2 - PCV13) 01/09/2001    There are no preventive care reminders to display for this patient.  Lab Results  Component Value Date   TSH 2.53 08/09/2018   Lab Results  Component  Value Date   WBC 4.0 11/08/2017   HGB 14.2 11/08/2017   HCT 41.3 11/08/2017   MCV 95.3 11/08/2017   PLT 215.0 11/08/2017   Lab Results  Component Value Date   NA 139 11/08/2017   K 4.7 11/08/2017   CO2 32 11/08/2017   GLUCOSE 110 (H) 11/08/2017   BUN 19 11/08/2017   CREATININE 0.99 11/08/2017   BILITOT 0.6 11/08/2017   ALKPHOS 67 11/08/2017   AST 19 11/08/2017   ALT 7 11/08/2017   PROT 6.9  11/08/2017   ALBUMIN 4.1 11/08/2017   CALCIUM 9.7 11/08/2017   GFR 76.95 11/08/2017   Lab Results  Component Value Date   CHOL 125 11/08/2017   Lab Results  Component Value Date   HDL 54.10 11/08/2017   Lab Results  Component Value Date   LDLCALC 62 11/08/2017   Lab Results  Component Value Date   TRIG 47.0 11/08/2017   Lab Results  Component Value Date   CHOLHDL 2 11/08/2017   No results found for: HGBA1C    Assessment & Plan:   Problem List Items Addressed This Visit      Digestive   Slow transit constipation   Relevant Medications   docusate sodium (COLACE) 100 MG capsule   Other Relevant Orders   CBC   Basic metabolic panel   Urinalysis, Routine w reflex microscopic     Endocrine   Hypothyroidism - Primary   Relevant Orders   TSH      Meds ordered this encounter  Medications  . docusate sodium (COLACE) 100 MG capsule    Sig: Take 1 capsule (100 mg total) by mouth 2 (two) times daily.    Dispense:  180 capsule    Refill:  4    Follow-up: Return in about 3 months (around 02/14/2019).   Patient was given information on constipation.  Will add Colace standing 100 mg twice a day.  Follow-up in 3 months.

## 2019-01-04 ENCOUNTER — Telehealth: Payer: Self-pay | Admitting: Family Medicine

## 2019-01-04 NOTE — Telephone Encounter (Signed)
Called to see if patient wanted to go ahead and schedule 3 month follow up for June, left message

## 2019-01-09 ENCOUNTER — Ambulatory Visit: Payer: Medicare Other | Admitting: Neurology

## 2019-02-08 ENCOUNTER — Encounter: Payer: Self-pay | Admitting: Family Medicine

## 2019-02-08 ENCOUNTER — Ambulatory Visit (INDEPENDENT_AMBULATORY_CARE_PROVIDER_SITE_OTHER): Payer: Medicare Other | Admitting: Family Medicine

## 2019-02-08 VITALS — BP 120/70 | HR 89 | Ht 70.0 in | Wt 170.2 lb

## 2019-02-08 DIAGNOSIS — E039 Hypothyroidism, unspecified: Secondary | ICD-10-CM | POA: Diagnosis not present

## 2019-02-08 DIAGNOSIS — K5901 Slow transit constipation: Secondary | ICD-10-CM | POA: Diagnosis not present

## 2019-02-08 DIAGNOSIS — R351 Nocturia: Secondary | ICD-10-CM

## 2019-02-08 DIAGNOSIS — N401 Enlarged prostate with lower urinary tract symptoms: Secondary | ICD-10-CM | POA: Diagnosis not present

## 2019-02-08 LAB — TSH: TSH: 2.91 u[IU]/mL (ref 0.35–4.50)

## 2019-02-08 MED ORDER — TAMSULOSIN HCL 0.4 MG PO CAPS: 0.4000 mg | ORAL_CAPSULE | Freq: Every day | ORAL | 3 refills | Status: DC

## 2019-02-08 NOTE — Progress Notes (Signed)
Established Patient Office Visit  Subjective:  Patient ID: Ian Mcknight, male    DOB: 01/25/1934  Age: 85 y.o. MRN: [id number]  CC:  Chief Complaint  Patient presents with   Follow-up    HPI Ian Mcknight presents for follow-up of his hypothyroidism.  Continues to take the Synthroid as directed first thing in the morning before eating.  Believes that it occasionally affects his mentation and causes drowsiness.  Reassured him that these are not typical Synthroid side effects.  Reports that he is sleeping up to 8 hours a night but gets up 2-3 times nightly to urinate.  Goes to bed at 10 and his last consumed liquids are headache.  Continues to stay active throughout the day by walking and working in his garden.  Constipation is improved with exercise, increased vegetables, eating prunes and occasional Colace usage.  Denies abdominal pain hematuria nausea and vomiting or bleeding per rectum.  History reviewed. No pertinent past medical history.  Past Surgical History:  Procedure Laterality Date   INGUINAL HERNIA REPAIR Right    PROSTATE SURGERY     TONSILLECTOMY      Family History  Problem Relation Age of Onset   Dementia Mother    Angina Father    Breast cancer Sister    Heart disease Sister    Cancer Sister    Other Child        1 with mental retardation/seizures    Social History   Socioeconomic History   Marital status: Married    Spouse name: Not on file   Number of children: Not on file   Years of education: Not on file   Highest education level: Not on file  Occupational History   Occupation: retired    Comment: Hydrologistbuilder/printer  Social Needs   Financial resource strain: Not on file   Food insecurity:    Worry: Not on file    Inability: Not on file   Transportation needs:    Medical: Not on file    Non-medical: Not on file  Tobacco Use   Smoking status: Former Smoker    Last attempt to quit: 07/18/1969    Years since quitting: 49.5    Smokeless tobacco: Never Used   Tobacco comment: light smoker  Substance and Sexual Activity   Alcohol use: No    Frequency: Never   Drug use: No   Sexual activity: Not on file  Lifestyle   Physical activity:    Days per week: Not on file    Minutes per session: Not on file   Stress: Not on file  Relationships   Social connections:    Talks on phone: Not on file    Gets together: Not on file    Attends religious service: Not on file    Active member of club or organization: Not on file    Attends meetings of clubs or organizations: Not on file    Relationship status: Not on file   Intimate partner violence:    Fear of current or ex partner: Not on file    Emotionally abused: Not on file    Physically abused: Not on file    Forced sexual activity: Not on file  Other Topics Concern   Not on file  Social History Narrative   Not on file    Outpatient Medications Prior to Visit  Medication Sig Dispense Refill   b complex vitamins capsule Take 1 capsule by mouth daily. 100 capsule 1   carbidopa-levodopa (  SINEMET IR) 25-100 MG tablet TAKE ONE TABLET BY MOUTH THREE TIMES A DAY 270 tablet 1   docusate sodium (COLACE) 100 MG capsule Take 1 capsule (100 mg total) by mouth 2 (two) times daily. 180 capsule 4   levothyroxine (SYNTHROID) 50 MCG tablet Take 1 tablet (50 mcg total) by mouth daily before breakfast. 90 tablet 2   MULTIPLE VITAMIN PO Take by mouth.     UNABLE TO FIND Omega Q  Bladder health     No facility-administered medications prior to visit.     Allergies  Allergen Reactions   Penicillins    Sulfa Antibiotics    Viagra [Sildenafil Citrate]     ROS Review of Systems  Constitutional: Negative for chills, diaphoresis, fatigue, fever and unexpected weight change.  Eyes: Negative for photophobia and visual disturbance.  Respiratory: Negative.   Cardiovascular: Negative.   Gastrointestinal: Negative.   Endocrine: Negative for cold intolerance,  heat intolerance, polyphagia and polyuria.  Genitourinary: Negative for decreased urine volume, difficulty urinating and frequency.  Musculoskeletal: Negative for gait problem and joint swelling.  Skin: Negative for pallor and rash.  Allergic/Immunologic: Negative for immunocompromised state.  Neurological: Negative for light-headedness and headaches.  Hematological: Does not bruise/bleed easily.  Psychiatric/Behavioral: Negative.  Negative for sleep disturbance.      Objective:    Physical Exam  Constitutional: He is oriented to person, place, and time. He appears well-developed and well-nourished. No distress.  HENT:  Head: Normocephalic and atraumatic.  Right Ear: External ear normal.  Left Ear: External ear normal.  Eyes: Pupils are equal, round, and reactive to light. Conjunctivae are normal. Right eye exhibits no discharge. Left eye exhibits no discharge. No scleral icterus.  Neck: Neck supple. No JVD present. No tracheal deviation present. No thyromegaly present.  Cardiovascular: Normal rate, regular rhythm and normal heart sounds.  Occasional extrasystoles are present.  Pulmonary/Chest: Effort normal and breath sounds normal. No stridor.  Abdominal: Soft. Bowel sounds are normal. He exhibits no distension. There is no abdominal tenderness. There is no rebound and no guarding.  Lymphadenopathy:    He has no cervical adenopathy.  Neurological: He is alert and oriented to person, place, and time.  Skin: Skin is warm and dry. He is not diaphoretic.  Psychiatric: He has a normal mood and affect.    BP 120/70    Pulse 89    Ht 5\' 10"  (1.778 m)    Wt 170 lb 4 oz (77.2 kg)    SpO2 96%    BMI 24.43 kg/m  Wt Readings from Last 3 Encounters:  02/08/19 170 lb 4 oz (77.2 kg)  11/14/18 171 lb (77.6 kg)  08/15/18 171 lb (77.6 kg)   BP Readings from Last 3 Encounters:  02/08/19 120/70  11/14/18 120/74  08/15/18 124/70   Guideline developer:  UpToDate (see UpToDate for funding  source) Date Released: June 2014  Health Maintenance Due  Topic Date Due   TETANUS/TDAP  01/10/1955   PNA vac Low Risk Adult (1 of 2 - PCV13) 01/09/2001    There are no preventive care reminders to display for this patient.  Lab Results  Component Value Date   TSH 3.01 11/14/2018   Lab Results  Component Value Date   WBC 3.9 (L) 11/14/2018   HGB 14.6 11/14/2018   HCT 42.4 11/14/2018   MCV 93.9 11/14/2018   PLT 210.0 11/14/2018   Lab Results  Component Value Date   NA 139 11/14/2018   K 4.9 11/14/2018  CO2 30 11/14/2018   GLUCOSE 102 (H) 11/14/2018   BUN 21 11/14/2018   CREATININE 1.17 11/14/2018   BILITOT 0.6 11/08/2017   ALKPHOS 67 11/08/2017   AST 19 11/08/2017   ALT 7 11/08/2017   PROT 6.9 11/08/2017   ALBUMIN 4.1 11/08/2017   CALCIUM 9.5 11/14/2018   GFR 59.56 (L) 11/14/2018   Lab Results  Component Value Date   CHOL 125 11/08/2017   Lab Results  Component Value Date   HDL 54.10 11/08/2017   Lab Results  Component Value Date   LDLCALC 62 11/08/2017   Lab Results  Component Value Date   TRIG 47.0 11/08/2017   Lab Results  Component Value Date   CHOLHDL 2 11/08/2017   No results found for: HGBA1C    Assessment & Plan:   Problem List Items Addressed This Visit      Digestive   Slow transit constipation     Endocrine   Hypothyroidism - Primary   Relevant Orders   TSH     Other   Benign prostatic hyperplasia with nocturia   Relevant Medications   tamsulosin (FLOMAX) 0.4 MG CAPS capsule      Meds ordered this encounter  Medications   tamsulosin (FLOMAX) 0.4 MG CAPS capsule    Sig: Take 1 capsule (0.4 mg total) by mouth daily after supper.    Dispense:  30 capsule    Refill:  3    Follow-up: Return in about 3 months (around 05/11/2019).   Reassured him again that Synthroid is a unlikely cause for his symptoms.  Will address his nocturia and see if this will help to relieve some of the daytime drowsiness he is experiencing.

## 2019-02-08 NOTE — Patient Instructions (Signed)
Tamsulosin capsules What is this medicine? TAMSULOSIN (tam SOO loe sin) is an alpha blocker. It is used to treat the signs and symptoms of an enlarged prostate in men. This condition is also called benign prostatic hyperplasia (BPH). This medicine may be used for other purposes; ask your health care provider or pharmacist if you have questions. COMMON BRAND NAME(S): Flomax What should I tell my health care provider before I take this medicine? They need to know if you have any of the following conditions: -advanced kidney disease -advanced liver disease -low blood pressure -prostate cancer -an unusual or allergic reaction to tamsulosin, sulfa drugs, other medicines, foods, dyes, or preservatives -pregnant or trying to get pregnant -breast-feeding How should I use this medicine? Take this medicine by mouth about 30 minutes after the same meal every day. Follow the directions on the prescription label. Swallow the capsules whole with a glass of water. Do not crush, chew, or open capsules. Do not take your medicine more often than directed. Do not stop taking your medicine unless your doctor tells you to. Talk to your pediatrician regarding the use of this medicine in children. Special care may be needed. Overdosage: If you think you have taken too much of this medicine contact a poison control center or emergency room at once. NOTE: This medicine is only for you. Do not share this medicine with others. What if I miss a dose? If you miss a dose, take it as soon as you can. If it is almost time for your next dose, take only that dose. Do not take double or extra doses. If you stop taking your medicine for several days or more, ask your doctor or health care professional what dose you should start back on. What may interact with this medicine? -cimetidine -fluoxetine -ketoconazole -medicines for erectile disfunction like sildenafil, tadalafil, vardenafil -medicines for high blood  pressure -other alpha-blockers like alfuzosin, doxazosin, phentolamine, phenoxybenzamine, prazosin, terazosin -warfarin This list may not describe all possible interactions. Give your health care provider a list of all the medicines, herbs, non-prescription drugs, or dietary supplements you use. Also tell them if you smoke, drink alcohol, or use illegal drugs. Some items may interact with your medicine. What should I watch for while using this medicine? Visit your doctor or health care professional for regular check ups. You will need lab work done before you start this medicine and regularly while you are taking it. Check your blood pressure as directed. Ask your health care professional what your blood pressure should be, and when you should contact him or her. This medicine may make you feel dizzy or lightheaded. This is more likely to happen after the first dose, after an increase in dose, or during hot weather or exercise. Drinking alcohol and taking some medicines can make this worse. Do not drive, use machinery, or do anything that needs mental alertness until you know how this medicine affects you. Do not sit or stand up quickly. If you begin to feel dizzy, sit down until you feel better. These effects can decrease once your body adjusts to the medicine. Contact your doctor or health care professional right away if you have an erection that lasts longer than 4 hours or if it becomes painful. This may be a sign of a serious problem and must be treated right away to prevent permanent damage. If you are thinking of having cataract surgery, tell your eye surgeon that you have taken this medicine. What side effects may I notice  from receiving this medicine? Side effects that you should report to your doctor or health care professional as soon as possible: -allergic reactions like skin rash or itching, hives, swelling of the lips, mouth, tongue, or throat -breathing problems -change in  vision -feeling faint or lightheaded -irregular heartbeat -prolonged or painful erection -weakness Side effects that usually do not require medical attention (report to your doctor or health care professional if they continue or are bothersome): -back pain -change in sex drive or performance -constipation, nausea or vomiting -cough -drowsy -runny or stuffy nose -trouble sleeping This list may not describe all possible side effects. Call your doctor for medical advice about side effects. You may report side effects to FDA at 1-800-FDA-1088. Where should I keep my medicine? Keep out of the reach of children. Store at room temperature between 15 and 30 degrees C (59 and 86 degrees F). Throw away any unused medicine after the expiration date. NOTE: This sheet is a summary. It may not cover all possible information. If you have questions about this medicine, talk to your doctor, pharmacist, or health care provider.  2019 Elsevier/Gold Standard (2018-01-26 12:54:06) Levothyroxine tablets What is this medicine? LEVOTHYROXINE (lee voe thye ROX een) is a thyroid hormone. This medicine can improve symptoms of thyroid deficiency such as slow speech, lack of energy, weight gain, hair loss, dry skin, and feeling cold. It also helps to treat goiter (an enlarged thyroid gland). It is also used to treat some kinds of thyroid cancer along with surgery and other medicines. This medicine may be used for other purposes; ask your health care provider or pharmacist if you have questions. COMMON BRAND NAME(S): Estre, Euthyrox, Levo-T, Levothroid, Levoxyl, Synthroid, Thyro-Tabs, Unithroid What should I tell my health care provider before I take this medicine? They need to know if you have any of these conditions: -Addison's disease or other adrenal gland problem -angina -bone problems -diabetes -dieting or on a weight loss program -fertility problems -heart disease -pituitary gland problem -take medicines  that treat or prevent blood clots -an unusual or allergic reaction to levothyroxine, thyroid hormones, other medicines, foods, dyes, or preservatives -pregnant or trying to get pregnant -breast-feeding How should I use this medicine? Take this medicine by mouth with plenty of water. It is best to take on an empty stomach, at least 30 minutes before or 2 hours after food. Follow the directions on the prescription label. Take at the same time each day. Do not take your medicine more often than directed. Contact your pediatrician regarding the use of this medicine in children. While this drug may be prescribed for children and infants as young as a few days of age for selected conditions, precautions do apply. For infants, you may crush the tablet and place in a small amount of (5-10 ml or 1 to 2 teaspoonfuls) of water, breast milk, or non-soy based infant formula. Do not mix with soy-based infant formula. Give as directed. Overdosage: If you think you have taken too much of this medicine contact a poison control center or emergency room at once. NOTE: This medicine is only for you. Do not share this medicine with others. What if I miss a dose? If you miss a dose, take it as soon as you can. If it is almost time for your next dose, take only that dose. Do not take double or extra doses. What may interact with this medicine? -amiodarone -antacids -anti-thyroid medicines -calcium supplements -carbamazepine -certain medicines for depression -certain medicines to treat  cancer -cholestyramine -clofibrate -colesevelam -colestipol -digoxin -male hormones, like estrogens or progestins and birth control pills, patches, rings, or injections -iron supplements -kayexylate -ketamine -liquid nutrition products like Ensure -lithium -medicines for colds and breathing difficulties -medicines for diabetes -medicines or dietary supplements for weight  loss -methadone -niacin -orlistat -oxandrolone -phenobarbital or other barbiturates -phenytoin -rifampin -sevelamer -simethicone -soy isoflavones -steroid medicines like prednisone or cortisone -sucralfate -testosterone -theophylline -warfarin This list may not describe all possible interactions. Give your health care provider a list of all the medicines, herbs, non-prescription drugs, or dietary supplements you use. Also tell them if you smoke, drink alcohol, or use illegal drugs. Some items may interact with your medicine. What should I watch for while using this medicine? Be sure to take this medicine with plenty of fluids. Some tablets may cause choking, gagging, or difficulty swallowing from the tablet getting stuck in your throat. Most of these problems disappear if the medicine is taken with the right amount of water or other fluids. Do not switch brands of this medicine unless your health care professional agrees with the change. Ask questions if you are uncertain. You will need regular exams and occasional blood tests to check the response to treatment. If you are receiving this medicine for an underactive thyroid, it may be several weeks before you notice an improvement. Check with your doctor or health care professional if your symptoms do not improve. It may be necessary for you to take this medicine for the rest of your life. Do not stop using this medicine unless your doctor or health care professional advises you to. This medicine can affect blood sugar levels. If you have diabetes, check your blood sugar as directed. You may lose some of your hair when you first start treatment. With time, this usually corrects itself. If you are going to have surgery, tell your doctor or health care professional that you are taking this medicine. What side effects may I notice from receiving this medicine? Side effects that you should report to your doctor or health care professional as  soon as possible: -allergic reactions like skin rash, itching or hives, swelling of the face, lips, or tongue -anxious -breathing problems -changes in menstrual periods -chest pain -diarrhea -excessive sweating or intolerance to heat -fast or irregular heartbeat -leg cramps -nervousness -swelling of ankles, feet, or legs -tremors -trouble sleeping -vomiting Side effects that usually do not require medical attention (report to your doctor or health care professional if they continue or are bothersome): -changes in appetite -headache -irritable -nausea -weight loss This list may not describe all possible side effects. Call your doctor for medical advice about side effects. You may report side effects to FDA at 1-800-FDA-1088. Where should I keep my medicine? Keep out of the reach of children. Store at room temperature between 15 and 30 degrees C (59 and 86 degrees F). Protect from light and moisture. Keep container tightly closed. Throw away any unused medicine after the expiration date. NOTE: This sheet is a summary. It may not cover all possible information. If you have questions about this medicine, talk to your doctor, pharmacist, or health care provider.  2019 Elsevier/Gold Standard (2016-10-04 15:39:30)

## 2019-02-09 NOTE — Progress Notes (Signed)
Ian Mcknight was seen today in the movement disorders clinic for neurologic consultation at the request of Clerance Lav.  This patient is accompanied in the office by his spouse who supplements the history.   The consultation is for the evaluation of cognitive change and dysarthria.  The records that were [facility] available to me were reviewed.  Wife concerned about possible PD.    11/04/17 update: Patient is seen today, accompanied by his wife who supplements the history.  He was diagnosed with Parkinson's disease last visit and started on carbidopa/levodopa 25/100 and work to 1 tablet 3 times per day. Pt thinks that it has been helpful.  He is going to the [facility] cycle class and "I am thrilled with it."  One fall and he just slipped on mud and fell.  He is drooling much less.  Patient was seen by urology at the beginning of November.  I have reviewed those records.  Cialis was prescribed at that visit for erectile dysfunction. C/o urinary frequency/nocturia of 3-5 times per night.  He is using the urinal.  He is using an OTC bladder supplement.    03/10/18 update: Patient is seen today in follow-up for Parkinson's disease.  This patient is accompanied in the office by his spouse who supplements the history.Patient is on carbidopa/levodopa 25/100, 1 tablet 3 times per day.  Pt denies falls.  Pt denies lightheadedness, near syncope.  No hallucinations.  Mood has been good.  Patient's thyroid function was off last visit.  He did follow-up with his primary care physician in that regard.  He was started on Synthroid.  He stopped it for a short period of time as he thought it was causing stomach upset, but restarted it last month after seeing his primary care physician.  He now states that he thinks that he thinks that the carbidopa/levodopa 25/100 sometimes causes stomach upset but only has issue in the AM.  He finds he does okay if stomach has food in it.  He asks me about his supplements but then  didn't bring the supplements he is taking.  No falls.  He is going to the [employer] cycle class for PD.    08/10/18 update: Patient is seen today in follow-up for Parkinson's disease, accompanied by his wife who supplements history.  Patient is on carbidopa/levodopa 25/100, 1 tablet 3 times per day.  He has had no falls since last visit.  No lightheadedness or near syncope.  No hallucinations.  Records were reviewed since our last visit, including his visit to primary care and his ophthalmology records.  Ophthalmology did recommend that he start artificial tears due to dry eyes in the setting of Parkinson's disease.  The drops are helping  02/12/19 update: Patient seen today in follow-up for Parkinson's disease.  Patient is on carbidopa/levodopa 25/100, 1 tablet 3 times per day.  No falls since last visit.  No lightheadedness or near syncope.  Unable to exercise with cycle class and "I miss that."  Walking for exercise and some running.  Medical records have been reviewed since last visit.  Last saw primary care on June 4.  Drowsiness was addressed.  Pt states that it usually comes at 2 pm, and he thinks that it "probably would happen to most anyone."  He isn't sure that it is related to med but "it may be some."  PREVIOUS MEDICATIONS: none to date  Allergies  Allergen Reactions   Penicillins    Sulfa Antibiotics  Viagra [Sildenafil Citrate]     Current Outpatient Medications on File Prior to Visit  Medication Sig Dispense Refill   b complex vitamins capsule Take 1 capsule by mouth daily. 100 capsule 1   carbidopa-levodopa (SINEMET IR) 25-100 MG tablet TAKE ONE TABLET BY MOUTH THREE TIMES A DAY 270 tablet 1   docusate sodium (COLACE) 100 MG capsule Take 1 capsule (100 mg total) by mouth 2 (two) times daily. 180 capsule 4   levothyroxine (SYNTHROID) 50 MCG tablet Take 1 tablet (50 mcg total) by mouth daily before breakfast. 90 tablet 2   MULTIPLE VITAMIN PO Take by mouth.      tamsulosin (FLOMAX) 0.4 MG CAPS capsule Take 1 capsule (0.4 mg total) by mouth daily after supper. 30 capsule 3   UNABLE TO FIND Omega Q  Bladder health     No current facility-administered medications on file prior to visit.    History reviewed. No pertinent past medical history.   Past Surgical History:  Procedure Laterality Date   INGUINAL HERNIA REPAIR Right    PROSTATE SURGERY     TONSILLECTOMY      Social History   Socioeconomic History   Marital status: Married    Spouse name: Not on file   Number of children: Not on file   Years of education: Not on file   Highest education level: Not on file  Occupational History   Occupation: retired    Comment: Theme park manager strain: Not on file   Food insecurity:    Worry: Not on file    Inability: Not on file   Transportation needs:    Medical: Not on file    Non-medical: Not on file  Tobacco Use   Smoking status: Former Smoker    Last attempt to quit: 07/18/1969    Years since quitting: 49.6   Smokeless tobacco: Never Used   Tobacco comment: light smoker  Substance and Sexual Activity   Alcohol use: No    Frequency: Never   Drug use: No   Sexual activity: Not on file  Lifestyle   Physical activity:    Days per week: Not on file    Minutes per session: Not on file   Stress: Not on file  Relationships   Social connections:    Talks on phone: Not on file    Gets together: Not on file    Attends religious service: Not on file    Active member of club or organization: Not on file    Attends meetings of clubs or organizations: Not on file    Relationship status: Not on file  Other Topics Concern   Not on file  Social History Narrative   Not on file   Family History  Problem Relation Age of Onset   Dementia Mother    Angina Father    Breast cancer Sister    Heart disease Sister    Cancer Sister    Other Child        1 with mental  retardation/seizures    ROS:  Review of Systems  Constitutional: Positive for malaise/fatigue.  HENT: Negative.   Eyes: Negative.   Respiratory: Negative.   Cardiovascular: Negative.   Gastrointestinal: Negative.   Genitourinary: Negative.   Skin: Negative.   Endo/Heme/Allergies: Negative.     PHYSICAL EXAMINATION:    VITALS:   Vitals:   02/13/19 0840  BP: 132/77  Pulse: 72  Temp: 98.3 F (36.8 C)  SpO2:  96%    GEN:  The patient appears stated age and is in NAD. HEENT:  Normocephalic, atraumatic.  The mucous membranes are moist. The superficial temporal arteries are without ropiness or tenderness. CV:  rrr Lungs:  ctab Neck/HEME:  There are no carotid bruits bilaterally.    Orientation: alert and oriented x 3 Montreal Cognitive Assessment  07/18/2017  Visuospatial/ Executive (0/5) 4  Naming (0/3) 3  Attention: Read list of digits (0/2) 2  Attention: Read list of letters (0/1) 1  Attention: Serial 7 subtraction starting at 100 (0/3) 3  Language: Repeat phrase (0/2) 2  Language : Fluency (0/1) 0  Abstraction (0/2) 2  Delayed Recall (0/5) 3  Orientation (0/6) 6  Total 26  Adjusted Score (based on education) 26     Neurological examination:  Orientation: The patient is alert and oriented x3. Cranial nerves: There is good facial symmetry. The speech is fluent and clear. Soft palate rises symmetrically and there is no tongue deviation. Hearing is intact to conversational tone. Sensation: Sensation is intact to light touch throughout Motor: Strength is 5/5 in the bilateral upper and lower extremities.   Shoulder shrug is equal and symmetric.  There is no pronator drift.  Movement examination: Tone: There is normal tone in the bilateral UE/LE Abnormal movements: none Coordination:  There is very mild decremation with any form of RAMS, including alternating supination and pronation of the forearm, hand opening and closing, finger taps on the R.  Toe taps and heel  taps are good bilaterally.   Gait and Station: The patient has no difficulty arising out of a deep-seated chair without the use of the hands. The patient's stride length is good.  He runs very quickly down the hall  (same as last visit).  He has decreased arm swing on the right.  Labs: Labs have been reviewed from his primary care provider dated May 20, 2017.  Sodium was 137, potassium 4.4, chloride 101, CO2 30, BUN 19 and creatinine 0.98.  AST 19, ALT 14.  No TSH has been completed and records are available in care everywhere back to 2015.   ASSESSMENT/PLAN:  1. Idiopathic Parkinson's disease, diagnosed at the end of 2018.  -continue carbidopa/levodopa 25/100 tid.  We discussed changing to the CR as is c/o EDS but he doesn't think that it is the medication (and neither do I).  He notes no wearing off of medication  -he asks many questions and I answered them to the best of my ability  -Talked about goal setting and reasonable goals.   Have discussed at prior visits as well.  -has dermatology appointment in July  2.  Sialorrhea  -This is commonly associated with PD.  He thinks that this is better since starting carbidopa/levodopa.  3.  Nocturia  -seeing urology  4.  Depression  -I think that pt is somewhat depressed.  May be related to inability to get out due to pandemic.  Misses [employer] cycle classes.  Misses church.  -discussed counseling.  Met with PD social worker today.  5.  Much greater than 50% of this visit was spent in counseling and coordinating care.  Total face to face time:  25 min   Cc:  Libby Maw, MD

## 2019-02-13 ENCOUNTER — Ambulatory Visit (INDEPENDENT_AMBULATORY_CARE_PROVIDER_SITE_OTHER): Payer: Medicare Other | Admitting: Neurology

## 2019-02-13 ENCOUNTER — Encounter: Payer: Self-pay | Admitting: Neurology

## 2019-02-13 ENCOUNTER — Ambulatory Visit: Payer: Medicare Other | Admitting: Neurology

## 2019-02-13 ENCOUNTER — Other Ambulatory Visit: Payer: Self-pay

## 2019-02-13 VITALS — BP 132/77 | HR 72 | Temp 98.3°F | Ht 69.0 in | Wt 172.6 lb

## 2019-02-13 DIAGNOSIS — G4710 Hypersomnia, unspecified: Secondary | ICD-10-CM | POA: Diagnosis not present

## 2019-02-13 DIAGNOSIS — F330 Major depressive disorder, recurrent, mild: Secondary | ICD-10-CM

## 2019-02-13 DIAGNOSIS — G20 Parkinson's disease: Secondary | ICD-10-CM | POA: Diagnosis not present

## 2019-02-13 MED ORDER — CARBIDOPA-LEVODOPA 25-100 MG PO TABS: 1.0000 | ORAL_TABLET | Freq: Three times a day (TID) | ORAL | 1 refills | Status: DC

## 2019-02-13 NOTE — Patient Instructions (Signed)
Good to see you!  Let me know if you want medication or counseling for your mood.  It may really help!

## 2019-02-15 ENCOUNTER — Ambulatory Visit (INDEPENDENT_AMBULATORY_CARE_PROVIDER_SITE_OTHER): Payer: Medicare Other | Admitting: Family Medicine

## 2019-02-15 ENCOUNTER — Encounter: Payer: Self-pay | Admitting: Family Medicine

## 2019-02-15 DIAGNOSIS — R351 Nocturia: Secondary | ICD-10-CM

## 2019-02-15 DIAGNOSIS — N401 Enlarged prostate with lower urinary tract symptoms: Secondary | ICD-10-CM

## 2019-02-15 NOTE — Progress Notes (Signed)
Established Patient Office Visit  Subjective:  Patient ID: Ian Mcknight, male    DOB: 08/11/1934  Age: 85 y.o. MRN: [id number]  CC:  Chief Complaint  Patient presents with  . Follow-up    medication is not working    HPI Ian Mcknight presents for follow-up of his BPH symptoms.  Although his urine flow has improved with the Flomax he feels a little bit washed out on the medicine.  Nocturia has gone from 4 times nightly down to 2 times nightly.  He denies fevers or chills lower back pain.  History reviewed. No pertinent past medical history.  Past Surgical History:  Procedure Laterality Date  . INGUINAL HERNIA REPAIR Right   . PROSTATE SURGERY    . TONSILLECTOMY      Family History  Problem Relation Age of Onset  . Dementia Mother   . Angina Father   . Breast cancer Sister   . Heart disease Sister   . Cancer Sister   . Other Child        1 with mental retardation/seizures    Social History   Socioeconomic History  . Marital status: Married    Spouse name: Not on file  . Number of children: Not on file  . Years of education: Not on file  . Highest education level: Not on file  Occupational History  . Occupation: retired    Comment: Print production plannerbuilder/printer  Social Needs  . Financial resource strain: Not on file  . Food insecurity    Worry: Not on file    Inability: Not on file  . Transportation needs    Medical: Not on file    Non-medical: Not on file  Tobacco Use  . Smoking status: Former Smoker    Quit date: 07/18/1969    Years since quitting: 49.6  . Smokeless tobacco: Never Used  . Tobacco comment: light smoker  Substance and Sexual Activity  . Alcohol use: No    Frequency: Never  . Drug use: No  . Sexual activity: Not on file  Lifestyle  . Physical activity    Days per week: Not on file    Minutes per session: Not on file  . Stress: Not on file  Relationships  . Social Musicianconnections    Talks on phone: Not on file    Gets together: Not on file    Attends  religious service: Not on file    Active member of club or organization: Not on file    Attends meetings of clubs or organizations: Not on file    Relationship status: Not on file  . Intimate partner violence    Fear of current or ex partner: Not on file    Emotionally abused: Not on file    Physically abused: Not on file    Forced sexual activity: Not on file  Other Topics Concern  . Not on file  Social History Narrative  . Not on file    Outpatient Medications Prior to Visit  Medication Sig Dispense Refill  . b complex vitamins capsule Take 1 capsule by mouth daily. 100 capsule 1  . carbidopa-levodopa (SINEMET IR) 25-100 MG tablet Take 1 tablet by mouth 3 (three) times daily. 270 tablet 1  . docusate sodium (COLACE) 100 MG capsule Take 1 capsule (100 mg total) by mouth 2 (two) times daily. 180 capsule 4  . levothyroxine (SYNTHROID) 50 MCG tablet Take 1 tablet (50 mcg total) by mouth daily before breakfast. 90 tablet 2  .  MULTIPLE VITAMIN PO Take by mouth.    . tamsulosin (FLOMAX) 0.4 MG CAPS capsule Take 1 capsule (0.4 mg total) by mouth daily after supper. 30 capsule 3  . UNABLE TO FIND Omega Q  Bladder health     No facility-administered medications prior to visit.     Allergies  Allergen Reactions  . Penicillins   . Sulfa Antibiotics   . Viagra [Sildenafil Citrate]     ROS Review of Systems  Constitutional: Negative for diaphoresis, fatigue, fever and unexpected weight change.  Genitourinary: Positive for difficulty urinating. Negative for decreased urine volume, dysuria, frequency, hematuria and urgency.  Musculoskeletal: Negative for back pain.  Psychiatric/Behavioral: Negative.       Objective:    Physical Exam  Constitutional: He is oriented to person, place, and time. No distress.  Pulmonary/Chest: Effort normal.  Neurological: He is alert and oriented to person, place, and time.  Psychiatric: He has a normal mood and affect. His behavior is normal.     There were no vitals taken for this visit. Wt Readings from Last 3 Encounters:  02/13/19 172 lb 9.6 oz (78.3 kg)  02/08/19 170 lb 4 oz (77.2 kg)  11/14/18 171 lb (77.6 kg)   BP Readings from Last 3 Encounters:  02/13/19 132/77  02/08/19 120/70  11/14/18 120/74   Guideline developer:  UpToDate (see UpToDate for funding source) Date Released: June 2014  Health Maintenance Due  Topic Date Due  . TETANUS/TDAP  01/10/1955  . PNA vac Low Risk Adult (1 of 2 - PCV13) 01/09/2001    There are no preventive care reminders to display for this patient.  Lab Results  Component Value Date   TSH 2.91 02/08/2019   Lab Results  Component Value Date   WBC 3.9 (L) 11/14/2018   HGB 14.6 11/14/2018   HCT 42.4 11/14/2018   MCV 93.9 11/14/2018   PLT 210.0 11/14/2018   Lab Results  Component Value Date   NA 139 11/14/2018   K 4.9 11/14/2018   CO2 30 11/14/2018   GLUCOSE 102 (H) 11/14/2018   BUN 21 11/14/2018   CREATININE 1.17 11/14/2018   BILITOT 0.6 11/08/2017   ALKPHOS 67 11/08/2017   AST 19 11/08/2017   ALT 7 11/08/2017   PROT 6.9 11/08/2017   ALBUMIN 4.1 11/08/2017   CALCIUM 9.5 11/14/2018   GFR 59.56 (L) 11/14/2018   Lab Results  Component Value Date   CHOL 125 11/08/2017   Lab Results  Component Value Date   HDL 54.10 11/08/2017   Lab Results  Component Value Date   LDLCALC 62 11/08/2017   Lab Results  Component Value Date   TRIG 47.0 11/08/2017   Lab Results  Component Value Date   CHOLHDL 2 11/08/2017   No results found for: HGBA1C    Assessment & Plan:   Problem List Items Addressed This Visit      Other   Benign prostatic hyperplasia with nocturia - Primary   Relevant Orders   Ambulatory referral to Urology    Virtual Visit via Video Note  I connected with Ian Mcknight on 02/15/19 at  3:30 PM EDT by a video enabled telemedicine application and verified that I am speaking with the correct person using two identifiers.  Location: Patient: home  Provider:    I discussed the limitations of evaluation and management by telemedicine and the availability of in person appointments. The patient expressed understanding and agreed to proceed.  History of Present Illness:  Observations/Objective:   Assessment and Plan:   Follow Up Instructions:    I discussed the assessment and treatment plan with the patient. The patient was provided an opportunity to ask questions and all were answered. The patient agreed with the plan and demonstrated an understanding of the instructions.   The patient was advised to call back or seek an in-person evaluation if the symptoms worsen or if the condition fails to improve as anticipated.  I provided 15 minutes of non-face-to-face time during this encounter.   Mliss SaxWilliam Alfred Kaliyan Osbourn, MD   No orders of the defined types were placed in this encounter.   Follow-up: No follow-ups on file.   Patient will go ahead and stop the Flomax and follow-up with me for a face-to-face in 2 to 3 days.

## 2019-04-16 ENCOUNTER — Telehealth: Payer: Self-pay | Admitting: Family Medicine

## 2019-04-16 NOTE — Telephone Encounter (Signed)
Pt stopped in because he wasn't sure when Dr Ethelene Hal wanted to see him next, I told him I would send a message and then call him if we needed to schedule something

## 2019-04-16 NOTE — Telephone Encounter (Signed)
He is due for follow up on 9/4

## 2019-04-16 NOTE — Telephone Encounter (Signed)
Due for ov on 9/4.

## 2019-04-17 NOTE — Telephone Encounter (Signed)
Alright he's scheduled °

## 2019-05-10 ENCOUNTER — Telehealth: Payer: Self-pay

## 2019-05-10 NOTE — Telephone Encounter (Signed)

## 2019-05-11 ENCOUNTER — Encounter: Payer: Self-pay | Admitting: Family Medicine

## 2019-05-11 ENCOUNTER — Other Ambulatory Visit: Payer: Self-pay

## 2019-05-11 ENCOUNTER — Ambulatory Visit (INDEPENDENT_AMBULATORY_CARE_PROVIDER_SITE_OTHER): Payer: Medicare Other | Admitting: Family Medicine

## 2019-05-11 VITALS — BP 130/72 | HR 66 | Ht 69.0 in | Wt 170.1 lb

## 2019-05-11 DIAGNOSIS — N401 Enlarged prostate with lower urinary tract symptoms: Secondary | ICD-10-CM

## 2019-05-11 DIAGNOSIS — Z2882 Immunization not carried out because of caregiver refusal: Secondary | ICD-10-CM | POA: Diagnosis not present

## 2019-05-11 DIAGNOSIS — E860 Dehydration: Secondary | ICD-10-CM

## 2019-05-11 DIAGNOSIS — E039 Hypothyroidism, unspecified: Secondary | ICD-10-CM | POA: Diagnosis not present

## 2019-05-11 DIAGNOSIS — R351 Nocturia: Secondary | ICD-10-CM

## 2019-05-11 LAB — URINALYSIS, ROUTINE W REFLEX MICROSCOPIC
Bilirubin Urine: NEGATIVE
Ketones, ur: NEGATIVE
Leukocytes,Ua: NEGATIVE
Nitrite: NEGATIVE
Specific Gravity, Urine: 1.01 (ref 1.000–1.030)
Total Protein, Urine: NEGATIVE
Urine Glucose: NEGATIVE
Urobilinogen, UA: 0.2 (ref 0.0–1.0)
pH: 6 (ref 5.0–8.0)

## 2019-05-11 LAB — TSH: TSH: 2.98 u[IU]/mL (ref 0.35–4.50)

## 2019-05-11 NOTE — Progress Notes (Signed)
Established Patient Office Visit  Subjective:  Patient ID: Ian Mcknight, male    DOB: 03/25/1934  Age: 85 y.o. MRN: [id number]  CC:  Chief Complaint  Patient presents with  . Follow-up    HPI Ian Mcknight for follow-up of his hypothyroid insomnia, dehydration and benign prostatic hypertrophy.  Patient continues to take his levothyroxine in the morning on a fasting stomach.  He feels as though it is helping him.  He has been working on drinking more fluids.  He continues to stay active working in his garden and is getting out there early in the morning and going back inside by 11.  He has discontinued the tamsulosin.  He felt as though it was making him lightheaded.  He feels as though his urine flow is improved been improved by the exercising that he is doing and working in the garden and walking through the neighborhood.  Reports that levodopa is really helping his Parkinson's disease.  History reviewed. No pertinent past medical history.  Past Surgical History:  Procedure Laterality Date  . INGUINAL HERNIA REPAIR Right   . PROSTATE SURGERY    . TONSILLECTOMY      Family History  Problem Relation Age of Onset  . Dementia Mother   . Angina Father   . Breast cancer Sister   . Heart disease Sister   . Cancer Sister   . Other Child        1 with mental retardation/seizures    Social History   Socioeconomic History  . Marital status: Married    Spouse name: Not on file  . Number of children: Not on file  . Years of education: Not on file  . Highest education level: Not on file  Occupational History  . Occupation: retired    Comment: Print production plannerbuilder/printer  Social Needs  . Financial resource strain: Not on file  . Food insecurity    Worry: Not on file    Inability: Not on file  . Transportation needs    Medical: Not on file    Non-medical: Not on file  Tobacco Use  . Smoking status: Former Smoker    Quit date: 07/18/1969    Years since quitting: 49.8  . Smokeless tobacco:  Never Used  . Tobacco comment: light smoker  Substance and Sexual Activity  . Alcohol use: No    Frequency: Never  . Drug use: No  . Sexual activity: Not on file  Lifestyle  . Physical activity    Days per week: Not on file    Minutes per session: Not on file  . Stress: Not on file  Relationships  . Social Musicianconnections    Talks on phone: Not on file    Gets together: Not on file    Attends religious service: Not on file    Active member of club or organization: Not on file    Attends meetings of clubs or organizations: Not on file    Relationship status: Not on file  . Intimate partner violence    Fear of current or ex partner: Not on file    Emotionally abused: Not on file    Physically abused: Not on file    Forced sexual activity: Not on file  Other Topics Concern  . Not on file  Social History Narrative  . Not on file    Outpatient Medications Prior to Visit  Medication Sig Dispense Refill  . b complex vitamins capsule Take 1 capsule by mouth daily. 100 capsule  1  . carbidopa-levodopa (SINEMET IR) 25-100 MG tablet Take 1 tablet by mouth 3 (three) times daily. 270 tablet 1  . docusate sodium (COLACE) 100 MG capsule Take 1 capsule (100 mg total) by mouth 2 (two) times daily. 180 capsule 4  . levothyroxine (SYNTHROID) 50 MCG tablet Take 1 tablet (50 mcg total) by mouth daily before breakfast. 90 tablet 2  . MULTIPLE VITAMIN PO Take by mouth.    Marland Kitchen UNABLE TO FIND Omega Q  Bladder health    . tamsulosin (FLOMAX) 0.4 MG CAPS capsule Take 1 capsule (0.4 mg total) by mouth daily after supper. 30 capsule 3   No facility-administered medications prior to visit.     Allergies  Allergen Reactions  . Penicillins   . Sulfa Antibiotics   . Viagra [Sildenafil Citrate]     ROS Review of Systems  Constitutional: Negative.   HENT: Negative.   Eyes: Negative for photophobia and visual disturbance.  Respiratory: Negative.   Cardiovascular: Negative.   Gastrointestinal:  Negative.   Endocrine: Negative for cold intolerance and heat intolerance.  Genitourinary: Negative for difficulty urinating, frequency and urgency.  Musculoskeletal: Negative for arthralgias and myalgias.  Skin: Negative for pallor and rash.  Allergic/Immunologic: Negative for immunocompromised state.  Neurological: Negative for light-headedness and headaches.  Hematological: Does not bruise/bleed easily.  Psychiatric/Behavioral: Negative.       Objective:    Physical Exam  Constitutional: He is oriented to person, place, and time. He appears well-developed and well-nourished. No distress.  HENT:  Head: Normocephalic and atraumatic.  Right Ear: External ear normal.  Left Ear: External ear normal.  Mouth/Throat: Oropharynx is clear and moist. No oropharyngeal exudate.  Eyes: Pupils are equal, round, and reactive to light. Conjunctivae are normal. Right eye exhibits no discharge. Left eye exhibits no discharge. No scleral icterus.  Neck: No JVD present. No tracheal deviation present. No thyromegaly present.  Cardiovascular: Normal rate, regular rhythm and normal heart sounds.  Pulmonary/Chest: Effort normal and breath sounds normal. No stridor.  Abdominal: Bowel sounds are normal.  Musculoskeletal:        General: No edema.  Lymphadenopathy:    He has no cervical adenopathy.  Neurological: He is alert and oriented to person, place, and time.  Skin: Skin is warm and dry. He is not diaphoretic.  Psychiatric: He has a normal mood and affect. His behavior is normal.    BP 130/72   Pulse 66   Ht 5\' 9"  (1.753 m)   Wt 170 lb 2 oz (77.2 kg)   SpO2 97%   BMI 25.12 kg/m  Wt Readings from Last 3 Encounters:  05/11/19 170 lb 2 oz (77.2 kg)  02/13/19 172 lb 9.6 oz (78.3 kg)  02/08/19 170 lb 4 oz (77.2 kg)   BP Readings from Last 3 Encounters:  05/11/19 130/72  02/13/19 132/77  02/08/19 120/70   Guideline developer:  UpToDate (see UpToDate for funding source) Date Released: June  2014  Health Maintenance Due  Topic Date Due  . TETANUS/TDAP  01/10/1955  . PNA vac Low Risk Adult (1 of 2 - PCV13) 01/09/2001    There are no preventive care reminders to display for this patient.  Lab Results  Component Value Date   TSH 2.91 02/08/2019   Lab Results  Component Value Date   WBC 3.9 (L) 11/14/2018   HGB 14.6 11/14/2018   HCT 42.4 11/14/2018   MCV 93.9 11/14/2018   PLT 210.0 11/14/2018   Lab Results  Component  Value Date   NA 139 11/14/2018   K 4.9 11/14/2018   CO2 30 11/14/2018   GLUCOSE 102 (H) 11/14/2018   BUN 21 11/14/2018   CREATININE 1.17 11/14/2018   BILITOT 0.6 11/08/2017   ALKPHOS 67 11/08/2017   AST 19 11/08/2017   ALT 7 11/08/2017   PROT 6.9 11/08/2017   ALBUMIN 4.1 11/08/2017   CALCIUM 9.5 11/14/2018   GFR 59.56 (L) 11/14/2018   Lab Results  Component Value Date   CHOL 125 11/08/2017   Lab Results  Component Value Date   HDL 54.10 11/08/2017   Lab Results  Component Value Date   LDLCALC 62 11/08/2017   Lab Results  Component Value Date   TRIG 47.0 11/08/2017   Lab Results  Component Value Date   CHOLHDL 2 11/08/2017   No results found for: HGBA1C    Assessment & Plan:   Problem List Items Addressed This Visit      Endocrine   Hypothyroidism   Relevant Orders   TSH     Other   Benign prostatic hyperplasia with nocturia - Primary   Parent refuses immunizations   Dehydration   Relevant Orders   Urinalysis, Routine w reflex microscopic      No orders of the defined types were placed in this encounter.   Follow-up: Return in about 3 months (around 08/10/2019).   Patient declines the flu shot today and feels as though he has done better with it over the years.  Reminded him that the flu could be just as dangerous as the COVID virus.  He is not interested in trying an alternative medicine for his BPH at this time.  He will continue to increase his level of hydration as tolerated.

## 2019-07-16 ENCOUNTER — Encounter: Payer: Self-pay | Admitting: Family Medicine

## 2019-07-19 NOTE — Progress Notes (Signed)
Ian Mcknight was seen today in follow up for Parkinsons disease.   Pt denies falls.  Pt denies lightheadedness, near syncope.  No hallucinations.  Mood has been fair - he has been stressed over the politics of the election.  He is worried about his church/core beliefs (conservative christian) and feels that his health is affected by this and things going on in the country.  Was supposed to have dermatology appointment in July but missed that and states that he isn't scheduled until august.  "they took away my cycling because of the virus and that has been rough."  He is walking for exercising.  Medical records have been reviewed since our last visit.  Last saw his primary care physician on September 4.  C/o memory change.  Current prescribed movement disorder medications: Carbidopa/levodopa 25/100, 1 tablet 3 times per day   PREVIOUS MEDICATIONS: Sinemet  ALLERGIES:   Allergies  Allergen Reactions  . Penicillins   . Sulfa Antibiotics   . Viagra [Sildenafil Citrate]     CURRENT MEDICATIONS:  Outpatient Encounter Medications as of 07/20/2019  Medication Sig  . b complex vitamins capsule Take 1 capsule by mouth daily.  . carbidopa-levodopa (SINEMET IR) 25-100 MG tablet Take 1 tablet by mouth 3 (three) times daily.  Marland Kitchen. docusate sodium (COLACE) 100 MG capsule Take 1 capsule (100 mg total) by mouth 2 (two) times daily.  Marland Kitchen. levothyroxine (SYNTHROID) 50 MCG tablet Take 1 tablet (50 mcg total) by mouth daily before breakfast.  . MULTIPLE VITAMIN PO Take by mouth.  Marland Kitchen. UNABLE TO FIND Omega Q  Bladder health   No facility-administered encounter medications on file as of 07/20/2019.     PAST MEDICAL HISTORY:  History reviewed. No pertinent past medical history.  PAST SURGICAL HISTORY:   Past Surgical History:  Procedure Laterality Date  . INGUINAL HERNIA REPAIR Right   . PROSTATE SURGERY    . TONSILLECTOMY      SOCIAL HISTORY:   Social History   Socioeconomic History  . Marital  status: Married    Spouse name: Not on file  . Number of children: Not on file  . Years of education: Not on file  . Highest education level: Not on file  Occupational History  . Occupation: retired    Comment: Print production plannerbuilder/printer  Social Needs  . Financial resource strain: Not on file  . Food insecurity    Worry: Not on file    Inability: Not on file  . Transportation needs    Medical: Not on file    Non-medical: Not on file  Tobacco Use  . Smoking status: Former Smoker    Quit date: 07/18/1969    Years since quitting: 50.0  . Smokeless tobacco: Never Used  . Tobacco comment: light smoker  Substance and Sexual Activity  . Alcohol use: No    Frequency: Never  . Drug use: No  . Sexual activity: Not on file  Lifestyle  . Physical activity    Days per week: Not on file    Minutes per session: Not on file  . Stress: Not on file  Relationships  . Social Musicianconnections    Talks on phone: Not on file    Gets together: Not on file    Attends religious service: Not on file    Active member of club or organization: Not on file    Attends meetings of clubs or organizations: Not on file    Relationship status: Not on file  .  Intimate partner violence    Fear of current or ex partner: Not on file    Emotionally abused: Not on file    Physically abused: Not on file    Forced sexual activity: Not on file  Other Topics Concern  . Not on file  Social History Narrative  . Not on file    FAMILY HISTORY:   Family Status  Relation Name Status  . Mother  Deceased  . Father  Deceased  . Sister  Alive  . Sister  Alive  . Sister  Deceased  . Child x3 Alive  . Sister  Alive    ROS:  Review of Systems  Constitutional: Negative.   Eyes: Negative.   Cardiovascular: Negative.   Gastrointestinal: Negative.   Genitourinary: Negative.   Skin: Negative.     PHYSICAL EXAMINATION:    VITALS:   Vitals:   07/20/19 0906  BP: 140/84  Pulse: 75  Resp: 16  Temp: 98.2 F (36.8 C)   SpO2: 98%  Weight: 167 lb 9.6 oz (76 kg)  Height: 5\' 10"  (1.778 m)    GEN:  The patient appears stated age and is in NAD. HEENT:  Normocephalic, atraumatic.  The mucous membranes are moist. The superficial temporal arteries are without ropiness or tenderness. CV:  RRR Lungs:  CTAB Neck/HEME:  There are no carotid bruits bilaterally.  Neurological examination:  Orientation: The patient is alert and oriented x3. Cranial nerves: There is good facial symmetry with facial hypomimia. The speech is fluent and clear. Soft palate rises symmetrically and there is no tongue deviation. Hearing is intact to conversational tone. Sensation: Sensation is intact to light touch throughout Motor: Strength is at least antigravity x4.  Movement examination: Tone: There is normal tone in the upper extremities/lower extremities Abnormal movements: None Coordination:  There is mild decremation with RAM's, with hand opening and closing and finger taps bilaterally Gait and Station: The patient has no difficulty arising out of a deep-seated chair without the use of the hands. The patient's stride length is good with stooped posture at the waist.    Office Visit on 05/11/2019  Component Date Value Ref Range Status  . Color, Urine 05/11/2019 YELLOW  Yellow;Lt. Yellow;Straw;Dark Yellow;Amber;Green;Red;Brown Final  . APPearance 05/11/2019 CLEAR  Clear;Turbid;Slightly Cloudy;Cloudy Final  . Specific Gravity, Urine 05/11/2019 1.010  1.000 - 1.030 Final  . pH 05/11/2019 6.0  5.0 - 8.0 Final  . Total Protein, Urine 05/11/2019 NEGATIVE  Negative Final  . Urine Glucose 05/11/2019 NEGATIVE  Negative Final  . Ketones, ur 05/11/2019 NEGATIVE  Negative Final  . Bilirubin Urine 05/11/2019 NEGATIVE  Negative Final  . Hgb urine dipstick 05/11/2019 SMALL* Negative Final  . Urobilinogen, UA 05/11/2019 0.2  0.0 - 1.0 Final  . Leukocytes,Ua 05/11/2019 NEGATIVE  Negative Final  . Nitrite 05/11/2019 NEGATIVE  Negative Final  .  WBC, UA 05/11/2019 0-2/hpf  0-2/hpf Final  . RBC / HPF 05/11/2019 0-2/hpf  0-2/hpf Final  . Squamous Epithelial / LPF 05/11/2019 Rare(0-4/hpf)  Rare(0-4/hpf) Final  . TSH 05/11/2019 2.98  0.35 - 4.50 uIU/mL Final     Chemistry      Component Value Date/Time   NA 139 11/14/2018 0930   K 4.9 11/14/2018 0930   CL 104 11/14/2018 0930   CO2 30 11/14/2018 0930   BUN 21 11/14/2018 0930   CREATININE 1.17 11/14/2018 0930      Component Value Date/Time   CALCIUM 9.5 11/14/2018 0930   ALKPHOS 67 11/08/2017 1051  AST 19 11/08/2017 1051   ALT 7 11/08/2017 1051   BILITOT 0.6 11/08/2017 1051        ASSESSMENT/PLAN:  1. Idiopathic Parkinson's disease, diagnosed at the end of 2018.             -continue carbidopa/levodopa 25/100 tid.  We discussed changing to the CR as is c/o EDS but he doesn't think that it is the medication (and neither do I).  He notes no wearing off of medication             -he asks many questions and I answered them to the best of my ability             -Talked about goal setting and reasonable goals.   Have discussed at last multiple visits             -has dermatology appointment next august.  We discussed that it used to be thought that levodopa would increase risk of melanoma but now it is believed that Parkinsons itself likely increases risk of melanoma. he is to get regular skin checks.  -discussed online exercise programs  2.  Sialorrhea             -This is commonly associated with PD.  He thinks that this is better since starting carbidopa/levodopa.  3.  Nocturia             -seeing urology  4.  Depression             -he needs to see a counselor, particularly a Marketing executive.  He was agreeable.  I don't think that he would be able to do counseling with a non Marketing executive, as this is the basis for all of his being.  He was given resources.  5.  Memory change  -will refer to Dr. Melvyn Novas.  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  6.  Follow up is anticipated in the next 4-6 months, sooner should new neurologic issues arise.  Much greater than 50% of this visit was spent in counseling and coordinating care.  Total face to face time:  25 min  Cc:  Libby Maw, MD

## 2019-07-20 ENCOUNTER — Other Ambulatory Visit: Payer: Self-pay

## 2019-07-20 ENCOUNTER — Encounter: Payer: Self-pay | Admitting: Neurology

## 2019-07-20 ENCOUNTER — Ambulatory Visit (INDEPENDENT_AMBULATORY_CARE_PROVIDER_SITE_OTHER): Payer: Medicare Other | Admitting: Neurology

## 2019-07-20 VITALS — BP 140/84 | HR 75 | Temp 98.2°F | Resp 16 | Ht 70.0 in | Wt 167.6 lb

## 2019-07-20 DIAGNOSIS — R413 Other amnesia: Secondary | ICD-10-CM

## 2019-07-20 DIAGNOSIS — F330 Major depressive disorder, recurrent, mild: Secondary | ICD-10-CM | POA: Diagnosis not present

## 2019-07-20 NOTE — Patient Instructions (Signed)
Regarding Ian Mcknight counselor: You can try Ian Mcknight at [phone] in [place].  We have also attached some other resources to this visit that you can try.  You have been referred for a neurocognitive evaluation in our office.   The evaluation has two parts.   . The first part of the evaluation is a clinical interview with the neuropsychologist (Dr. Melvyn Novas or Dr. Nicole Kindred). Please bring someone with you to this appointment if possible, as it is helpful for the doctor to hear from both you and another adult who knows you well.   . The second part of the evaluation is testing with the doctor's technician Hinton Dyer or Maudie Mercury). The testing includes a variety of tasks- mostly question-and-answer, some paper-and-pencil. There is nothing you need to do to prepare for this appointment, but having a good night's sleep prior to the testing, taking medications as you normally would, and bringing eyeglasses and hearing aids (if you wear them), is advised. Please make sure that you wear a mask to the appointment.  Please note: We have to reserve several hours of the neuropsychologist's time and the psychometrician's time for your evaluation appointment. As such, please note that there is a No-Show fee of $100. If you are unable to attend any of your appointments, please contact our office as soon as possible to reschedule.

## 2019-08-07 HISTORY — PX: EYE SURGERY: SHX253

## 2019-08-10 ENCOUNTER — Ambulatory Visit: Payer: Medicare Other | Admitting: Family Medicine

## 2019-08-13 ENCOUNTER — Other Ambulatory Visit: Payer: Self-pay

## 2019-08-13 ENCOUNTER — Ambulatory Visit (INDEPENDENT_AMBULATORY_CARE_PROVIDER_SITE_OTHER): Payer: Medicare Other | Admitting: Family Medicine

## 2019-08-13 ENCOUNTER — Encounter: Payer: Self-pay | Admitting: Family Medicine

## 2019-08-13 VITALS — BP 116/72 | HR 72 | Temp 97.6°F | Ht 70.0 in | Wt 167.6 lb

## 2019-08-13 DIAGNOSIS — N5201 Erectile dysfunction due to arterial insufficiency: Secondary | ICD-10-CM

## 2019-08-13 DIAGNOSIS — E039 Hypothyroidism, unspecified: Secondary | ICD-10-CM | POA: Diagnosis not present

## 2019-08-13 DIAGNOSIS — Z2882 Immunization not carried out because of caregiver refusal: Secondary | ICD-10-CM | POA: Diagnosis not present

## 2019-08-13 DIAGNOSIS — Z0000 Encounter for general adult medical examination without abnormal findings: Secondary | ICD-10-CM

## 2019-08-13 LAB — TSH: TSH: 2.88 u[IU]/mL (ref 0.35–4.50)

## 2019-08-13 NOTE — Progress Notes (Addendum)
Established Patient Office Visit  Subjective:  Patient ID: Ian Mcknight, male    DOB: Jul 22, 1934  Age: 85 y.o. MRN: [id number]  CC:  Chief Complaint  Patient presents with  . 3 month follow up    HPI Ian Mcknight presents for follow-up of his hypothyroidism.  Reports compliance with his thyroid medicine.  He is taking on a fasting stomach every morning before eating.  Reports some issue with erectile dysfunction that has been helped with Cialis.  His wife has been having issues with intercourse.  He wonders if a natural product would be more appropriate for him.  Urine flow is good he tells me.  He is scheduled for cataract surgery in a month.  He continues to exercise by walking and actually has been sprinting some during his walks. History reviewed. No pertinent past medical history.  Past Surgical History:  Procedure Laterality Date  . INGUINAL HERNIA REPAIR Right   . PROSTATE SURGERY    . TONSILLECTOMY      Family History  Problem Relation Age of Onset  . Dementia Mother   . Angina Father   . Breast cancer Sister   . Heart disease Sister   . Cancer Sister   . Other Child        1 with mental retardation/seizures    Social History   Socioeconomic History  . Marital status: Married    Spouse name: Not on file  . Number of children: Not on file  . Years of education: Not on file  . Highest education level: Not on file  Occupational History  . Occupation: retired    Comment: Advertising account planner  Social Needs  . Financial resource strain: Not on file  . Food insecurity    Worry: Not on file    Inability: Not on file  . Transportation needs    Medical: Not on file    Non-medical: Not on file  Tobacco Use  . Smoking status: Former Smoker    Quit date: 07/18/1969    Years since quitting: 50.1  . Smokeless tobacco: Never Used  . Tobacco comment: light smoker  Substance and Sexual Activity  . Alcohol use: No    Frequency: Never  . Drug use: No  . Sexual activity: Not  on file  Lifestyle  . Physical activity    Days per week: Not on file    Minutes per session: Not on file  . Stress: Not on file  Relationships  . Social Herbalist on phone: Not on file    Gets together: Not on file    Attends religious service: Not on file    Active member of club or organization: Not on file    Attends meetings of clubs or organizations: Not on file    Relationship status: Not on file  . Intimate partner violence    Fear of current or ex partner: Not on file    Emotionally abused: Not on file    Physically abused: Not on file    Forced sexual activity: Not on file  Other Topics Concern  . Not on file  Social History Narrative  . Not on file    Outpatient Medications Prior to Visit  Medication Sig Dispense Refill  . b complex vitamins capsule Take 1 capsule by mouth daily. 100 capsule 1  . carbidopa-levodopa (SINEMET IR) 25-100 MG tablet Take 1 tablet by mouth 3 (three) times daily. 270 tablet 1  . docusate sodium (COLACE) 100  MG capsule Take 1 capsule (100 mg total) by mouth 2 (two) times daily. 180 capsule 4  . MULTIPLE VITAMIN PO Take by mouth.    Marland Kitchen UNABLE TO FIND Omega Q  Bladder health    . levothyroxine (SYNTHROID) 50 MCG tablet Take 1 tablet (50 mcg total) by mouth daily before breakfast. 90 tablet 2   No facility-administered medications prior to visit.     Allergies  Allergen Reactions  . Penicillins   . Sulfa Antibiotics   . Viagra [Sildenafil Citrate]     ROS Review of Systems  Constitutional: Negative.   HENT: Negative.   Eyes: Positive for visual disturbance. Negative for photophobia.  Respiratory: Negative.   Cardiovascular: Negative.   Gastrointestinal: Negative.   Endocrine: Negative for polyphagia and polyuria.  Genitourinary: Negative for difficulty urinating, frequency and urgency.  Musculoskeletal: Negative for gait problem and joint swelling.  Skin: Negative for pallor and rash.  Allergic/Immunologic: Negative  for immunocompromised state.  Neurological: Negative for light-headedness and headaches.  Hematological: Does not bruise/bleed easily.  Psychiatric/Behavioral: Negative.       Objective:    Physical Exam  Constitutional: He is oriented to person, place, and time. He appears well-developed and well-nourished. No distress.  HENT:  Head: Normocephalic and atraumatic.  Right Ear: External ear normal.  Left Ear: External ear normal.  Eyes: Conjunctivae are normal. Right eye exhibits no discharge. Left eye exhibits no discharge. No scleral icterus.  Neck: No JVD present. No tracheal deviation present.  Cardiovascular: Normal rate, regular rhythm and normal heart sounds.  Occasional extrasystoles are present.  Pulmonary/Chest: Effort normal and breath sounds normal. No stridor.  Musculoskeletal:        General: No edema.  Neurological: He is alert and oriented to person, place, and time.  Skin: Skin is warm and dry. He is not diaphoretic.  Psychiatric: He has a normal mood and affect. His behavior is normal.    BP 116/72   Pulse 72   Temp 97.6 F (36.4 C)   Ht 5\' 10"  (1.778 m)   Wt 167 lb 9.6 oz (76 kg)   SpO2 93%   BMI 24.05 kg/m  Wt Readings from Last 3 Encounters:  08/13/19 167 lb 9.6 oz (76 kg)  07/20/19 167 lb 9.6 oz (76 kg)  05/11/19 170 lb 2 oz (77.2 kg)     Health Maintenance Due  Topic Date Due  . TETANUS/TDAP  01/10/1955  . PNA vac Low Risk Adult (1 of 2 - PCV13) 01/09/2001    There are no preventive care reminders to display for this patient.  Lab Results  Component Value Date   TSH 2.88 08/13/2019   Lab Results  Component Value Date   WBC 3.9 (L) 11/14/2018   HGB 14.6 11/14/2018   HCT 42.4 11/14/2018   MCV 93.9 11/14/2018   PLT 210.0 11/14/2018   Lab Results  Component Value Date   NA 139 11/14/2018   K 4.9 11/14/2018   CO2 30 11/14/2018   GLUCOSE 102 (H) 11/14/2018   BUN 21 11/14/2018   CREATININE 1.17 11/14/2018   BILITOT 0.6 11/08/2017    ALKPHOS 67 11/08/2017   AST 19 11/08/2017   ALT 7 11/08/2017   PROT 6.9 11/08/2017   ALBUMIN 4.1 11/08/2017   CALCIUM 9.5 11/14/2018   GFR 59.56 (L) 11/14/2018   Lab Results  Component Value Date   CHOL 125 11/08/2017   Lab Results  Component Value Date   HDL 54.10 11/08/2017  Lab Results  Component Value Date   LDLCALC 62 11/08/2017   Lab Results  Component Value Date   TRIG 47.0 11/08/2017   Lab Results  Component Value Date   CHOLHDL 2 11/08/2017   No results found for: HGBA1C    Assessment & Plan:   Problem List Items Addressed This Visit      Cardiovascular and Mediastinum   Erectile dysfunction due to arterial insufficiency     Endocrine   Hypothyroidism   Relevant Medications   levothyroxine (SYNTHROID) 50 MCG tablet   Other Relevant Orders   TSH (Completed)     Other   Healthcare maintenance - Primary   Parent refuses immunizations      Meds ordered this encounter  Medications  . levothyroxine (SYNTHROID) 50 MCG tablet    Sig: Take 1 tablet (50 mcg total) by mouth daily before breakfast.    Dispense:  90 tablet    Refill:  2    Please fill with same manufacturer every time, thanks!    Follow-up: Return in about 3 months (around 11/11/2019).   Refuses flu vaccine again today but after our discussion he said that he would discuss it with his wife.  Says that he will probably get the Covid vaccine when available.  Advised him to stick with prescription medicines for ED and those in fact that there is less information available to natural products.  He was given information on preventing disease through vaccinations. Mliss SaxWilliam Alfred Kremer, MD

## 2019-08-13 NOTE — Patient Instructions (Signed)
Preventing Disease Through Immunization Immunization means developing a lower risk of getting a disease due to improvements in the body's disease-fighting system (immune system). Immunization can happen through:  Natural exposure to a disease.  Getting shots (vaccination). Vaccination involves putting a small amount of germs (vaccines) into the body. This may be done through one or more shots. Some vaccines can be given by mouth or as a nasal spray, instead of a shot. Vaccination helps to prevent:  Serious diseases such as polio, measles, and whooping cough.  Common infections, such as the flu. Vaccination starts at birth. Teens and adults also need vaccines regularly. Talk with your health care provider about the immunization schedule that is best for you. Some vaccines need to be repeated when you are older. How does immunization prevent disease? Immunization occurs when the body is exposed to germs that cause a certain disease. The body responds to this exposure by forming proteins (antibodies) to fight those germs. Germs in vaccines are dead or very weak, so they will not make you sick. However, the antibodies that your body makes will stay in your body for a long time. This improves the ability of your immune system to fight the germs in the future. If you get exposed to the germs again, you may be able to resist them (develop immunity against them). This is because your antibodies may be able to destroy the germs before you get sick. Why should I prevent diseases through immunization? Vaccines can protect you from getting diseases that can cause harmful complications and even death. Getting vaccinated also helps to keep other people healthy. If you are vaccinated, you cannot spread disease to others, and that can make the disease become less common. If people keep getting vaccinated, certain diseases may become rare or go away. If people stop getting vaccinated, certain diseases could become  more common. Not everyone can get a vaccine. Very young babies, people who are very sick, or older people may not be able to get vaccines. By getting immunized, you help to protect people who are not able to be vaccinated. Where to find more information To learn more about immunization, visit:  World Health Organization: www.who.int/topics/immunization/en  Centers for Disease Control and Prevention: www.cdc.gov/vaccines/index.html Summary  Immunization occurs when the body is exposed to germs that cause a certain disease and responds by forming proteins (antibodies) to fight those germs.  Getting vaccines is a safe and effective way to develop immunity against specific germs and the diseases that they cause.  Talk with your health care provider about your immunization schedule, and stay up to date with all of your shots. This information is not intended to replace advice given to you by your health care provider. Make sure you discuss any questions you have with your health care provider. Document Released: 10/15/2015 Document Revised: 12/15/2018 Document Reviewed: 05/01/2016 Elsevier Patient Education  2020 Elsevier Inc.  

## 2019-08-14 MED ORDER — LEVOTHYROXINE SODIUM 50 MCG PO TABS: 50.0000 ug | ORAL_TABLET | Freq: Every day | ORAL | 2 refills | Status: DC

## 2019-08-14 NOTE — Addendum Note (Signed)
Addended by: Jon Billings on: 08/14/2019 08:14 AM   Modules accepted: Orders

## 2019-10-04 ENCOUNTER — Encounter: Payer: Medicare Other | Admitting: Psychology

## 2019-10-06 ENCOUNTER — Other Ambulatory Visit: Payer: Self-pay | Admitting: Neurology

## 2019-10-11 ENCOUNTER — Encounter: Payer: Medicare Other | Admitting: Psychology

## 2019-11-08 ENCOUNTER — Ambulatory Visit: Payer: Medicare Other | Attending: Internal Medicine

## 2019-11-08 DIAGNOSIS — Z23 Encounter for immunization: Secondary | ICD-10-CM | POA: Insufficient documentation

## 2019-11-08 NOTE — Progress Notes (Signed)
   Covid-19 Vaccination Clinic  Name:  Ian Mcknight    MRN: [id number] DOB: 1934-02-21  11/08/2019  [name] was observed post Covid-19 immunization for 30 minutes based on pre-vaccination screening without incident. He was provided with Vaccine Information Sheet and instruction to access the V-Safe system.   [name] was instructed to call 911 with any severe reactions post vaccine: Marland Kitchen Difficulty breathing  . Swelling of face and throat  . A fast heartbeat  . A bad rash all over body  . Dizziness and weakness   Immunizations Administered    Name Date Dose VIS Date Route   Pfizer COVID-19 Vaccine 11/08/2019 10:08 AM 0.3 mL 08/17/2019 Intramuscular   Manufacturer: [employer], [employer]   Lot: [id number]   NDC: [address]-2

## 2019-11-15 ENCOUNTER — Telehealth: Payer: Self-pay | Admitting: Family Medicine

## 2019-11-15 NOTE — Telephone Encounter (Signed)
Left message for patient to schedule Annual Wellness Visit.  Please schedule with Nurse Health Advisor Victoria Britt, RN at [facility]  

## 2019-12-04 ENCOUNTER — Ambulatory Visit: Payer: Medicare Other | Attending: Internal Medicine

## 2019-12-04 DIAGNOSIS — Z23 Encounter for immunization: Secondary | ICD-10-CM

## 2019-12-04 NOTE — Progress Notes (Signed)
   Covid-19 Vaccination Clinic  Name:  Navin Dogan    MRN: [id number] DOB: 1934-06-05  12/04/2019  Mr. Cobbins was observed post Covid-19 immunization for 15 minutes without incident. He was provided with Vaccine Information Sheet and instruction to access the V-Safe system.   Mr. Sitter was instructed to call 911 with any severe reactions post vaccine: Marland Kitchen Difficulty breathing  . Swelling of face and throat  . A fast heartbeat  . A bad rash all over body  . Dizziness and weakness   Immunizations Administered    Name Date Dose VIS Date Route   Pfizer COVID-19 Vaccine 12/04/2019  2:36 PM 0.3 mL 08/17/2019 Intramuscular   Manufacturer: [employer], [employer]   Lot: [id number]   NDC: [address]-7

## 2019-12-11 NOTE — Progress Notes (Signed)
Nurse connected with patient 12/12/19 at  8:00 AM EDT by a telephone enabled telemedicine application and verified that I am speaking with the correct person using two identifiers. Patient stated full name and DOB. Patient gave permission to continue with virtual visit. Patient's location was at home and Nurse's location was at [place] office.   Subjective:   Ian Mcknight is a 86 y.o. male who presents for an Initial Medicare Annual Wellness Visit.  Enjoys reading his bible.  Review of Systems  Cardiac Risk Factors include: advanced age (>37men, >90 women);male gender Home Safety/Smoke Alarms: Feels safe in home. Smoke alarms in place.  Lives w/ wife. 1 story w/ basement. Pt reports he does well w/ stairs.   Male:   PSA- No results found for: PSA     Objective:      Advanced Directives 12/12/2019 02/13/2019  Does Patient Have a Medical Advance Directive? No No  Would patient like information on creating a medical advance directive? No - Patient declined -    Current Medications (verified) Outpatient Encounter Medications as of 12/12/2019  Medication Sig  . b complex vitamins capsule Take 1 capsule by mouth daily.  . carbidopa-levodopa (SINEMET IR) 25-100 MG tablet TAKE ONE TABLET BY MOUTH THREE TIMES A DAY  . docusate sodium (COLACE) 100 MG capsule Take 1 capsule (100 mg total) by mouth 2 (two) times daily.  Marland Kitchen levothyroxine (SYNTHROID) 50 MCG tablet Take 1 tablet (50 mcg total) by mouth daily before breakfast.  . MULTIPLE VITAMIN PO Take by mouth.  Marland Kitchen UNABLE TO FIND Omega Q  Bladder health   No facility-administered encounter medications on file as of 12/12/2019.    Allergies (verified) Penicillins, Sulfa antibiotics, and Viagra [sildenafil citrate]   History: History reviewed. No pertinent past medical history. Past Surgical History:  Procedure Laterality Date  . EYE SURGERY Bilateral 08/07/2019  . INGUINAL HERNIA REPAIR Right   . PROSTATE SURGERY    . TONSILLECTOMY      Family History  Problem Relation Age of Onset  . Dementia Mother   . Angina Father   . Breast cancer Sister   . Heart disease Sister   . Cancer Sister   . Other Child        1 with mental retardation/seizures   Social History   Socioeconomic History  . Marital status: Married    Spouse name: Not on file  . Number of children: Not on file  . Years of education: Not on file  . Highest education level: Not on file  Occupational History  . Occupation: retired    Comment: builder/printer  Tobacco Use  . Smoking status: Former Smoker    Quit date: 07/18/1969    Years since quitting: 50.4  . Smokeless tobacco: Never Used  . Tobacco comment: light smoker  Substance and Sexual Activity  . Alcohol use: No  . Drug use: No  . Sexual activity: Not on file  Other Topics Concern  . Not on file  Social History Narrative  . Not on file   Social Determinants of Health   Financial Resource Strain:   . Difficulty of Paying Living Expenses:   Food Insecurity:   . Worried About Programme researcher, broadcasting/film/video in the Last Year:   . Barista in the Last Year:   Transportation Needs:   . Freight forwarder (Medical):   Marland Kitchen Lack of Transportation (Non-Medical):   Physical Activity:   . Days of Exercise per Week:   .  Minutes of Exercise per Session:   Stress:   . Feeling of Stress :   Social Connections:   . Frequency of Communication with Friends and Family:   . Frequency of Social Gatherings with Friends and Family:   . Attends Religious Services:   . Active Member of Clubs or Organizations:   . Attends Banker Meetings:   Marland Kitchen Marital Status:    Tobacco Counseling Counseling given: Not Answered Comment: light smoker   Clinical Intake: Pain : No/denies pain    Activities of Daily Living In your present state of health, do you have any difficulty performing the following activities: 12/12/2019  Hearing? Y  Comment has hearing aids  Vision? N  Difficulty  concentrating or making decisions? N  Walking or climbing stairs? N  Dressing or bathing? N  Doing errands, shopping? N  Preparing Food and eating ? N  Using the Toilet? N  In the past six months, have you accidently leaked urine? N  Do you have problems with loss of bowel control? N  Managing your Medications? N  Managing your Finances? N  Housekeeping or managing your Housekeeping? N  Some recent data might be hidden     Immunizations and Health Maintenance Immunization History  Administered Date(s) Administered  . PFIZER SARS-COV-2 Vaccination 11/08/2019, 12/04/2019   Health Maintenance Due  Topic Date Due  . TETANUS/TDAP  Never done  . PNA vac Low Risk Adult (1 of 2 - PCV13) Never done    Patient Care Team: Mliss Sax, MD as PCP - General (Family Medicine)  Indicate any recent Medical Services you may have received from other than [facility] providers in the past year (date may be approximate).    Assessment:   This is a routine wellness examination for [name]. Physical assessment deferred to PCP.  Hearing/Vision screen Unable to assess. This visit is enabled though telemedicine due to Covid 19.   Dietary issues and exercise activities discussed: Current Exercise Habits: The patient does not participate in regular exercise at present, Exercise limited by: None identified Diet (meal preparation, eat out, water intake, caffeinated beverages, dairy products, fruits and vegetables): in general, a "healthy" diet  , well balanced   Goals    . Increase physical activity      Depression Screen PHQ 2/9 Scores 12/12/2019  PHQ - 2 Score 0    Fall Risk Fall Risk  12/12/2019 07/20/2019 02/13/2019 08/10/2018 03/10/2018  Falls in the past year? 0 0 0 0 No  Number falls in past yr: 0 0 0 0 -  Injury with Fall? 0 0 0 0 -  Risk for fall due to : - - - History of fall(s) -  Follow up Education provided;Falls prevention discussed - - Falls evaluation completed -    Cognitive  Function: Ad8 score reviewed for issues:  Issues making decisions:no  Less interest in hobbies / activities:no  Repeats questions, stories (family complaining):no  Trouble using ordinary gadgets (microwave, computer, phone):no  Forgets the month or year: no  Mismanaging finances: no  Remembering appts:no  Daily problems with thinking and/or memory:no Ad8 score is=0     Montreal Cognitive Assessment  07/18/2017  Visuospatial/ Executive (0/5) 4  Naming (0/3) 3  Attention: Read list of digits (0/2) 2  Attention: Read list of letters (0/1) 1  Attention: Serial 7 subtraction starting at 100 (0/3) 3  Language: Repeat phrase (0/2) 2  Language : Fluency (0/1) 0  Abstraction (0/2) 2  Delayed Recall (0/5)  3  Orientation (0/6) 6  Total 26  Adjusted Score (based on education) 26      Screening Tests Health Maintenance  Topic Date Due  . TETANUS/TDAP  Never done  . PNA vac Low Risk Adult (1 of 2 - PCV13) Never done  . INFLUENZA VACCINE  04/06/2020      Plan:    Please schedule your next medicare wellness visit with me in 1 yr.  Continue to eat heart healthy diet (full of fruits, vegetables, whole grains, lean protein, water--limit salt, fat, and sugar intake) and increase physical activity as tolerated.  Continue doing brain stimulating activities (puzzles, reading, adult coloring books, staying active) to keep memory sharp.     I have personally reviewed and noted the following in the patient's chart:   . Medical and social history . Use of alcohol, tobacco or illicit drugs  . Current medications and supplements . Functional ability and status . Nutritional status . Physical activity . Advanced directives . List of other physicians . Hospitalizations, surgeries, and ER visits in previous 12 months . Vitals . Screenings to include cognitive, depression, and falls . Referrals and appointments  In addition, I have reviewed and discussed with patient certain  preventive protocols, quality metrics, and best practice recommendations. A written personalized care plan for preventive services as well as general preventive health recommendations were provided to patient.     Shela Nevin, [place]   12/12/2019

## 2019-12-12 ENCOUNTER — Encounter: Payer: Self-pay | Admitting: *Deleted

## 2019-12-12 ENCOUNTER — Ambulatory Visit (INDEPENDENT_AMBULATORY_CARE_PROVIDER_SITE_OTHER): Payer: Medicare Other | Admitting: *Deleted

## 2019-12-12 DIAGNOSIS — Z0000 Encounter for general adult medical examination without abnormal findings: Secondary | ICD-10-CM

## 2019-12-12 NOTE — Patient Instructions (Signed)
Please schedule your next medicare wellness visit with me in 1 yr.  Continue to eat heart healthy diet (full of fruits, vegetables, whole grains, lean protein, water--limit salt, fat, and sugar intake) and increase physical activity as tolerated.  Continue doing brain stimulating activities (puzzles, reading, adult coloring books, staying active) to keep memory sharp.    Ian Mcknight , Thank you for taking time to come for your Medicare Wellness Visit. I appreciate your ongoing commitment to your health goals. Please review the following plan we discussed and let me know if I can assist you in the future.   These are the goals we discussed: Goals    . Increase physical activity       This is a list of the screening recommended for you and due dates:  Health Maintenance  Topic Date Due  . Tetanus Vaccine  Never done  . Pneumonia vaccines (1 of 2 - PCV13) Never done  . Flu Shot  04/06/2020    Preventive Care 65 Years and Older, Male Preventive care refers to lifestyle choices and visits with your health care provider that can promote health and wellness. This includes:  A yearly physical exam. This is also called an annual well check.  Regular dental and eye exams.  Immunizations.  Screening for certain conditions.  Healthy lifestyle choices, such as diet and exercise. What can I expect for my preventive care visit? Physical exam Your health care provider will check:  Height and weight. These may be used to calculate body mass index (BMI), which is a measurement that tells if you are at a healthy weight.  Heart rate and blood pressure.  Your skin for abnormal spots. Counseling Your health care provider may ask you questions about:  Alcohol, tobacco, and drug use.  Emotional well-being.  Home and relationship well-being.  Sexual activity.  Eating habits.  History of falls.  Memory and ability to understand (cognition).  Work and work Statistician. What  immunizations do I need?  Influenza (flu) vaccine  This is recommended every year. Tetanus, diphtheria, and pertussis (Tdap) vaccine  You may need a Td booster every 10 years. Varicella (chickenpox) vaccine  You may need this vaccine if you have not already been vaccinated. Zoster (shingles) vaccine  You may need this after age 29. Pneumococcal conjugate (PCV13) vaccine  One dose is recommended after age 36. Pneumococcal polysaccharide (PPSV23) vaccine  One dose is recommended after age 51. Measles, mumps, and rubella (MMR) vaccine  You may need at least one dose of MMR if you were born in 1957 or later. You may also need a second dose. Meningococcal conjugate (MenACWY) vaccine  You may need this if you have certain conditions. Hepatitis A vaccine  You may need this if you have certain conditions or if you travel or work in places where you may be exposed to hepatitis A. Hepatitis B vaccine  You may need this if you have certain conditions or if you travel or work in places where you may be exposed to hepatitis B. Haemophilus influenzae type b (Hib) vaccine  You may need this if you have certain conditions. You may receive vaccines as individual doses or as more than one vaccine together in one shot (combination vaccines). Talk with your health care provider about the risks and benefits of combination vaccines. What tests do I need? Blood tests  Lipid and cholesterol levels. These may be checked every 5 years, or more frequently depending on your overall health.  Hepatitis  C test.  Hepatitis B test. Screening  Lung cancer screening. You may have this screening every year starting at age 50 if you have a 30-pack-year history of smoking and currently smoke or have quit within the past 15 years.  Colorectal cancer screening. All adults should have this screening starting at age 72 and continuing until age 3. Your health care provider may recommend screening at age 26 if  you are at increased risk. You will have tests every 1-10 years, depending on your results and the type of screening test.  Prostate cancer screening. Recommendations will vary depending on your family history and other risks.  Diabetes screening. This is done by checking your blood sugar (glucose) after you have not eaten for a while (fasting). You may have this done every 1-3 years.  Abdominal aortic aneurysm (AAA) screening. You may need this if you are a current or former smoker.  Sexually transmitted disease (STD) testing. Follow these instructions at home: Eating and drinking  Eat a diet that includes fresh fruits and vegetables, whole grains, lean protein, and low-fat dairy products. Limit your intake of foods with high amounts of sugar, saturated fats, and salt.  Take vitamin and mineral supplements as recommended by your health care provider.  Do not drink alcohol if your health care provider tells you not to drink.  If you drink alcohol: ? Limit how much you have to 0-2 drinks a day. ? Be aware of how much alcohol is in your drink. In the [place] one drink equals one 12 oz bottle of beer (355 mL), one 5 oz glass of wine (148 mL), or one 1 oz glass of hard liquor (44 mL). Lifestyle  Take daily care of your teeth and gums.  Stay active. Exercise for at least 30 minutes on 5 or more days each week.  Do not use any products that contain nicotine or tobacco, such as cigarettes, e-cigarettes, and chewing tobacco. If you need help quitting, ask your health care provider.  If you are sexually active, practice safe sex. Use a condom or other form of protection to prevent STIs (sexually transmitted infections).  Talk with your health care provider about taking a low-dose aspirin or statin. What's next?  Visit your health care provider once a year for a well check visit.  Ask your health care provider how often you should have your eyes and teeth checked.  Stay up to date on all  vaccines. This information is not intended to replace advice given to you by your health care provider. Make sure you discuss any questions you have with your health care provider. Document Revised: 08/17/2018 Document Reviewed: 08/17/2018 Elsevier Patient Education  2020 [employer].

## 2020-01-03 ENCOUNTER — Other Ambulatory Visit: Payer: Self-pay | Admitting: Neurology

## 2020-01-03 NOTE — Telephone Encounter (Signed)
Rx(s) sent to pharmacy electronically.  

## 2020-01-30 ENCOUNTER — Ambulatory Visit: Payer: Medicare Other | Admitting: Neurology

## 2020-03-19 NOTE — Progress Notes (Signed)
Assessment/Plan:   1.  Parkinsons Disease  -Continue carbidopa/levodopa 25/100, 1 tablet 3 times per day  -Follow regularly with dermatology due to slightly increased risk of melanoma with Parkinson's disease.  2.  Sialorrhea  -This is commonly associated with PD.  We talked about treatments.  The patient is not a candidate for oral anticholinergic therapy because of increased risk of confusion and falls.  We discussed Botox (type A and B) and 1% atropine drops.  We discusssed that candy like lemon drops can help by stimulating mm of the oropharynx to induce swallowing.  3.  Nocturia  -Following with urology.  4.  Depression  -we will start lexapro, 10 mg daily.  R/B/SE were discussed.  The opportunity to ask questions was given and they were answered to the best of my ability.  The patient expressed understanding and willingness to follow the outlined treatment protocols.   Subjective:   Ian Mcknight was seen today in follow up for Parkinsons disease.  My previous records were reviewed prior to todays visit as well as outside records available to me. Pt denies falls.  Pt denies lightheadedness, near syncope.  Sometimes thinks that dreams are real and will need to ask wife if someone came and visited.  Never sees anyone in the day.  Mood has been variable - sometimes depressed.  We had scheduled the patient last visit for neurocognitive testing.  He canceled that.  He doesn't recall why he did that.  Current prescribed movement disorder medications: Carbidopa/levodopa 25/100, 1 tablet 3 times per day     ALLERGIES:   Allergies  Allergen Reactions  . Penicillins   . Sulfa Antibiotics   . Viagra [Sildenafil Citrate]     CURRENT MEDICATIONS:  Outpatient Encounter Medications as of 03/21/2020  Medication Sig  . b complex vitamins capsule Take 1 capsule by mouth daily.  . carbidopa-levodopa (SINEMET IR) 25-100 MG tablet TAKE ONE TABLET BY MOUTH THREE TIMES A DAY  . docusate sodium  (COLACE) 100 MG capsule Take 1 capsule (100 mg total) by mouth 2 (two) times daily. (Patient taking differently: Take 100 mg by mouth daily. )  . levothyroxine (SYNTHROID) 50 MCG tablet Take 1 tablet (50 mcg total) by mouth daily before breakfast.  . MULTIPLE VITAMIN PO Take 1 tablet by mouth daily.   . [DISCONTINUED] UNABLE TO FIND Omega Q  Bladder health (Patient not taking: Reported on 03/21/2020)   No facility-administered encounter medications on file as of 03/21/2020.    Objective:   PHYSICAL EXAMINATION:    VITALS:   Vitals:   03/21/20 1016  BP: 132/79  Pulse: 79  SpO2: 96%  Weight: 165 lb (74.8 kg)  Height: 5\' 10"  (1.778 m)    GEN:  The patient appears stated age and is in NAD. HEENT:  Normocephalic, atraumatic.  The mucous membranes are moist. The superficial temporal arteries are without ropiness or tenderness. CV:  RRR Lungs:  CTAB Neck/HEME:  There are no carotid bruits bilaterally.  Neurological examination:  Orientation: The patient is alert and oriented x3. Cranial nerves: There is good facial symmetry with min facial hypomimia. The speech is fluent and clear. Soft palate rises symmetrically and there is no tongue deviation. Hearing is intact to conversational tone. Sensation: Sensation is intact to light touch throughout Motor: Strength is at least antigravity x4.  Movement examination: Tone: There is normal tone in the UE/LE Abnormal movements: none Coordination:  There is mild decremation with RAM's, with hand opening and  closing on the L Gait and Station: The patient has no difficulty arising out of a deep-seated chair without the use of the hands. The patient's stride length is good.   I have reviewed and interpreted the following labs independently    Chemistry      Component Value Date/Time   NA 139 11/14/2018 0930   K 4.9 11/14/2018 0930   CL 104 11/14/2018 0930   CO2 30 11/14/2018 0930   BUN 21 11/14/2018 0930   CREATININE 1.17 11/14/2018 0930       Component Value Date/Time   CALCIUM 9.5 11/14/2018 0930   ALKPHOS 67 11/08/2017 1051   AST 19 11/08/2017 1051   ALT 7 11/08/2017 1051   BILITOT 0.6 11/08/2017 1051       Lab Results  Component Value Date   WBC 3.9 (L) 11/14/2018   HGB 14.6 11/14/2018   HCT 42.4 11/14/2018   MCV 93.9 11/14/2018   PLT 210.0 11/14/2018    Lab Results  Component Value Date   TSH 2.88 08/13/2019     Total time spent on today's visit was 30 minutes, including both face-to-face time and nonface-to-face time.  Time included that spent on review of records (prior notes available to me/labs/imaging if pertinent), discussing treatment and goals, answering patient's questions and coordinating care.  Cc:  Mliss Sax, MD

## 2020-03-21 ENCOUNTER — Ambulatory Visit (INDEPENDENT_AMBULATORY_CARE_PROVIDER_SITE_OTHER): Payer: Medicare Other | Admitting: Neurology

## 2020-03-21 ENCOUNTER — Encounter: Payer: Self-pay | Admitting: Neurology

## 2020-03-21 ENCOUNTER — Other Ambulatory Visit: Payer: Self-pay

## 2020-03-21 VITALS — BP 132/79 | HR 79 | Ht 70.0 in | Wt 165.0 lb

## 2020-03-21 DIAGNOSIS — F330 Major depressive disorder, recurrent, mild: Secondary | ICD-10-CM | POA: Diagnosis not present

## 2020-03-21 DIAGNOSIS — G20 Parkinson's disease: Secondary | ICD-10-CM

## 2020-03-21 MED ORDER — ESCITALOPRAM OXALATE 10 MG PO TABS: 10.0000 mg | ORAL_TABLET | Freq: Every day | ORAL | 1 refills | Status: DC

## 2020-03-21 MED ORDER — CARBIDOPA-LEVODOPA 25-100 MG PO TABS: 1.0000 | ORAL_TABLET | Freq: Three times a day (TID) | ORAL | 1 refills | Status: DC

## 2020-03-21 NOTE — Patient Instructions (Addendum)
1.  Start lexapro 10 mg daily.  You can take this any time of day 2.  Continue carbidopa/levodopa 25/100, 1 tablet at 8am/noon/4pm 3.  Exercise!  The physicians and staff at [facility] are committed to providing excellent care. You may receive a survey requesting feedback about your experience at our office. We strive to receive "very good" responses to the survey questions. If you feel that your experience would prevent you from giving the office a "very good " response, please contact our office to try to remedy the situation. We may be reached at [phone]. Thank you for taking the time out of your busy day to complete the survey.

## 2020-03-31 ENCOUNTER — Telehealth: Payer: Self-pay

## 2020-03-31 NOTE — Telephone Encounter (Signed)
Patient received new meds a few days ago. Doesn't feel that new medication is working for him. Feels dizzy and light headed. Would like to stop for awhile and then have Dr. Arbutus Leas prescribe a different med. Patient does not know the medication name. Please call to advise

## 2020-03-31 NOTE — Telephone Encounter (Signed)
Pt c/o:  side effect on medication New medication started: lexapro When did they start medication?  ~1 week ago When did side effects start? Right after starting lexapro Side effects reported: worsened depression, weakness Still taking medication? No.   Pt states he is going to stop the medication and see if he can get back to baseline, told him to call back next week and Dr Tat will be back in the office and advise on a different medication he can try, he verbalized understanding.

## 2020-04-03 NOTE — Telephone Encounter (Signed)
Please call the patient back and make sure no SI/HI.  Find out how doing now.  If feeling better now, have him stay OFF med for 2 weeks and then restart it at 1/2 the dosage (so lexapro 10 mg, 1/2 tablet daily) and see how he does.  If any SI/HI, go to ER

## 2020-04-03 NOTE — Telephone Encounter (Signed)
Left VM requesting call back

## 2020-04-03 NOTE — Telephone Encounter (Signed)
Spoke with pt who states he is feeling better, denies SI/HI. Told him that Dr Tat would like him to stay off the Lexapro for 2 weeks then start back on it at 1/2 tablet daily and see if he tolerates it better, he verbalized understanding and agrees to try this and will call back if he feels bad after starting Lexapro 5 mg/daily.

## 2020-06-27 ENCOUNTER — Telehealth: Payer: Self-pay | Admitting: Neurology

## 2020-06-27 NOTE — Telephone Encounter (Signed)
Patient called in wanting to let Dr. Arbutus Leas know he has been very low on energy and gets extremely sleepy and has a hard time staying awake after a meal.

## 2020-06-27 NOTE — Telephone Encounter (Signed)
Has he talked with PCP about that?  I'm not sure that trouble staying awake after a meal is a Parkinsons Disease symptom.  I started him on this dose of carbidopa/levodopa 25/100 in 2018 and haven't changed it since so don't think that we can blame it on that.  Have pt f/u with PCP

## 2020-06-30 NOTE — Telephone Encounter (Signed)
Patient notified and voiced understanding. Also gave patient Dr Evangeline Gula phone number he voiced understanding.

## 2020-07-01 ENCOUNTER — Other Ambulatory Visit: Payer: Self-pay

## 2020-07-02 ENCOUNTER — Encounter: Payer: Self-pay | Admitting: Family Medicine

## 2020-07-02 ENCOUNTER — Other Ambulatory Visit: Payer: Self-pay | Admitting: Family Medicine

## 2020-07-02 ENCOUNTER — Ambulatory Visit (INDEPENDENT_AMBULATORY_CARE_PROVIDER_SITE_OTHER): Payer: Medicare Other | Admitting: Family Medicine

## 2020-07-02 VITALS — BP 120/68 | HR 66 | Temp 97.6°F | Ht 70.0 in | Wt 168.6 lb

## 2020-07-02 DIAGNOSIS — F5104 Psychophysiologic insomnia: Secondary | ICD-10-CM | POA: Insufficient documentation

## 2020-07-02 DIAGNOSIS — E039 Hypothyroidism, unspecified: Secondary | ICD-10-CM | POA: Diagnosis not present

## 2020-07-02 LAB — TSH: TSH: 3.4 u[IU]/mL (ref 0.35–4.50)

## 2020-07-02 MED ORDER — QUETIAPINE FUMARATE 25 MG PO TABS: 25.0000 mg | ORAL_TABLET | Freq: Every day | ORAL | 2 refills | Status: DC

## 2020-07-02 NOTE — Progress Notes (Signed)
Established Patient Office Visit  Subjective:  Patient ID: Ian Mcknight, male    DOB: 1933/12/12  Age: 86 y.o. MRN: [id number]  CC:  Chief Complaint  Patient presents with  . Follow-up    discuss adjusting medications and refills would like to decrease synthroid to 25mg  some medications are making pt very tired.     HPI Ian Mcknight presents for follow-up of his hypothyroidism.  He complains of fatigue and somnolence and wonders if his dose of thyroid medicine is too high.  He continues to take it in the morning on a fasting stomach he tells me.  Saw neurology back in July.  He continues with carbidopa/levodopa 25/100 3 times daily.  He was started on Lexapro for depression at that time but never started the medicine.  He is at the clinic alone today and is having somewhat of a hard time expressing his concerns to me.  He is concerned that his wife is having an affair because he saw her talking to somebody at the mailbox.  He tells me that he has experienced hallucinations.  He has had some problems with nocturia and sleep he tells me.  I asked him for his wife's phone number and I called her.  Her name is August. Phone # [phone].  We discussed his situation.  She assured me she is not having an affair.  She tells me that he for the most part he sleeps through the night and has not needed to see a urologist in years.  I asked her to accompany him for all future appointments and she said that she would.  No past medical history on file.  Past Surgical History:  Procedure Laterality Date  . EYE SURGERY Bilateral 08/07/2019  . INGUINAL HERNIA REPAIR Right   . PROSTATE SURGERY    . TONSILLECTOMY      Family History  Problem Relation Age of Onset  . Dementia Mother   . Angina Father   . Breast cancer Sister   . Heart disease Sister   . Cancer Sister   . Other Child        1 with mental retardation/seizures    Social History   Socioeconomic History  . Marital status: Married     Spouse name: Not on file  . Number of children: Not on file  . Years of education: Not on file  . Highest education level: Not on file  Occupational History  . Occupation: retired    Comment: builder/printer  Tobacco Use  . Smoking status: Former Smoker    Quit date: 07/18/1969    Years since quitting: 50.9  . Smokeless tobacco: Never Used  . Tobacco comment: light smoker  Vaping Use  . Vaping Use: Never used  Substance and Sexual Activity  . Alcohol use: No  . Drug use: No  . Sexual activity: Not on file  Other Topics Concern  . Not on file  Social History Narrative  . Not on file   Social Determinants of Health   Financial Resource Strain:   . Difficulty of Paying Living Expenses: Not on file  Food Insecurity:   . Worried About 13/08/1969 in the Last Year: Not on file  . Ran Out of Food in the Last Year: Not on file  Transportation Needs:   . Lack of Transportation (Medical): Not on file  . Lack of Transportation (Non-Medical): Not on file  Physical Activity:   . Days of Exercise per Week: Not  on file  . Minutes of Exercise per Session: Not on file  Stress:   . Feeling of Stress : Not on file  Social Connections:   . Frequency of Communication with Friends and Family: Not on file  . Frequency of Social Gatherings with Friends and Family: Not on file  . Attends Religious Services: Not on file  . Active Member of Clubs or Organizations: Not on file  . Attends Banker Meetings: Not on file  . Marital Status: Not on file  Intimate Partner Violence:   . Fear of Current or Ex-Partner: Not on file  . Emotionally Abused: Not on file  . Physically Abused: Not on file  . Sexually Abused: Not on file    Outpatient Medications Prior to Visit  Medication Sig Dispense Refill  . b complex vitamins capsule Take 1 capsule by mouth daily. 100 capsule 1  . carbidopa-levodopa (SINEMET IR) 25-100 MG tablet Take 1 tablet by mouth 3 (three) times daily. 270  tablet 1  . levothyroxine (SYNTHROID) 50 MCG tablet Take 1 tablet (50 mcg total) by mouth daily before breakfast. 90 tablet 2  . MULTIPLE VITAMIN PO Take 1 tablet by mouth daily.     Marland Kitchen docusate sodium (COLACE) 100 MG capsule Take 1 capsule (100 mg total) by mouth 2 (two) times daily. (Patient not taking: Reported on 07/02/2020) 180 capsule 4  . escitalopram (LEXAPRO) 10 MG tablet Take 1 tablet (10 mg total) by mouth daily. 90 tablet 1   No facility-administered medications prior to visit.    Allergies  Allergen Reactions  . Penicillins   . Sulfa Antibiotics   . Viagra [Sildenafil Citrate]     ROS Review of Systems  Constitutional: Negative.   HENT: Negative.   Eyes: Negative for photophobia and visual disturbance.  Respiratory: Negative.   Cardiovascular: Negative.   Gastrointestinal: Negative.   Endocrine: Negative for polyphagia and polyuria.  Genitourinary: Negative.   Musculoskeletal: Positive for gait problem and joint swelling.  Neurological: Positive for tremors and speech difficulty. Negative for seizures.  Hematological: Does not bruise/bleed easily.  Psychiatric/Behavioral: Positive for dysphoric mood, hallucinations and sleep disturbance.      Objective:    Physical Exam Vitals and nursing note reviewed.  Constitutional:      General: He is not in acute distress.    Appearance: He is ill-appearing. He is not toxic-appearing or diaphoretic.  HENT:     Right Ear: External ear normal.     Left Ear: External ear normal.  Eyes:     General: No scleral icterus.       Right eye: No discharge.        Left eye: No discharge.     Conjunctiva/sclera: Conjunctivae normal.  Cardiovascular:     Rate and Rhythm: Normal rate and regular rhythm.  Pulmonary:     Effort: Pulmonary effort is normal.     Breath sounds: Normal breath sounds.  Neurological:     Mental Status: He is alert.  Psychiatric:        Mood and Affect: Mood normal.        Behavior: Behavior normal.      BP 120/68   Pulse 66   Temp 97.6 F (36.4 C) (Tympanic)   Ht 5\' 10"  (1.778 m)   Wt 168 lb 9.6 oz (76.5 kg)   SpO2 95%   BMI 24.19 kg/m  Wt Readings from Last 3 Encounters:  07/02/20 168 lb 9.6 oz (76.5 kg)  03/21/20  165 lb (74.8 kg)  08/13/19 167 lb 9.6 oz (76 kg)     Health Maintenance Due  Topic Date Due  . TETANUS/TDAP  Never done  . PNA vac Low Risk Adult (1 of 2 - PCV13) Never done    There are no preventive care reminders to display for this patient.  Lab Results  Component Value Date   TSH 2.88 08/13/2019   Lab Results  Component Value Date   WBC 3.9 (L) 11/14/2018   HGB 14.6 11/14/2018   HCT 42.4 11/14/2018   MCV 93.9 11/14/2018   PLT 210.0 11/14/2018   Lab Results  Component Value Date   NA 139 11/14/2018   K 4.9 11/14/2018   CO2 30 11/14/2018   GLUCOSE 102 (H) 11/14/2018   BUN 21 11/14/2018   CREATININE 1.17 11/14/2018   BILITOT 0.6 11/08/2017   ALKPHOS 67 11/08/2017   AST 19 11/08/2017   ALT 7 11/08/2017   PROT 6.9 11/08/2017   ALBUMIN 4.1 11/08/2017   CALCIUM 9.5 11/14/2018   GFR 59.56 (L) 11/14/2018   Lab Results  Component Value Date   CHOL 125 11/08/2017   Lab Results  Component Value Date   HDL 54.10 11/08/2017   Lab Results  Component Value Date   LDLCALC 62 11/08/2017   Lab Results  Component Value Date   TRIG 47.0 11/08/2017   Lab Results  Component Value Date   CHOLHDL 2 11/08/2017   No results found for: HGBA1C    Assessment & Plan:   Problem List Items Addressed This Visit      Endocrine   Hypothyroidism - Primary   Relevant Orders   TSH     Other   Psychophysiological insomnia   Relevant Medications   QUEtiapine (SEROQUEL) 25 MG tablet      Meds ordered this encounter  Medications  . QUEtiapine (SEROQUEL) 25 MG tablet    Sig: Take 1 tablet (25 mg total) by mouth at bedtime.    Dispense:  30 tablet    Refill:  2    Follow-up: Return in about 1 month (around 08/02/2020).  Will try  low-dose Seroquel at nighttime.  Hopefully this will help.  Asked him to return in 1 month with his wife accompanying him.  Her phone number is listed above.  Mliss Sax, MD

## 2020-07-07 ENCOUNTER — Telehealth: Payer: Self-pay

## 2020-07-07 NOTE — Telephone Encounter (Signed)
No,I cannot decrease the dose. Please reassure him that it is not causing his symptoms.

## 2020-07-07 NOTE — Telephone Encounter (Signed)
Patient calling for TSH/lab results and states that he would like to know if Dr. Doreene Burke would decrease synthroid from 50 mg to 25 mg? Please advise.

## 2020-07-08 NOTE — Telephone Encounter (Signed)
Patient aware that medication can not be decreased at this time.

## 2020-07-09 ENCOUNTER — Encounter: Payer: Self-pay | Admitting: Family Medicine

## 2020-10-05 ENCOUNTER — Other Ambulatory Visit: Payer: Self-pay | Admitting: Neurology

## 2020-10-06 NOTE — Telephone Encounter (Signed)
Rx(s) sent to pharmacy electronically.  Pharmacy stated last rx had been filled on 07/11/2020.

## 2020-10-06 NOTE — Progress Notes (Signed)
Assessment/Plan:   1.  Parkinsons Disease  -d/c carbidopa/levodopa 25/100, 1 tablet 3 times per day to see if contributing to EDS  -start carbidopa/levodopa 25/100 CR, 1 po tid at 7am/11am/4pm.  2.  Depression  -Primary care recently the patient on quetiapine, 25 mg daily for hallucinations and delusions as well as mood but pt not taking it.  It is unclear  3.  Nocturia  -Following with urology.  4.  Parkinsons Disease hallucinations  -will see if changes with change to CR levodopa  -if not consider nuplazid   Subjective:   Ian Mcknight was seen today in follow up for Parkinsons disease.  My previous records were reviewed prior to todays visit as well as outside records available to me. Pt thinks that carbidopa/levodopa 25/100 making him sleepy.  Doesn't feel that way in late afternoon/evening once "it wears off."  Pt had one fall - fell out of the chair while sleeping.  Pt denies lightheadedness, near syncope.  Last visit, the patient was feeling depressed.  We started him on Lexapro.  He called [place] a few weeks later stating that he was much more depressed.  We had him stop the Lexapro for a few weeks and then restarted at half the dose.  He was supposed to call me back and let me know how he did.  Primary care notes from October indicate that he never actually started the Lexapro.  Primary care notes indicate possible delusions and some hallucinations.  Primary care started the patient on quetiapine, 25 mg daily.  He isn't taking that either.  Its unclear why - wife states "i'm not even familiar with it."  Both wife/pt admit to hallucinations.  Current prescribed movement disorder medications: Carbidopa/levodopa 25/100, 1 tablet 3 times per day Lexapro, 10 mg daily, half tablet daily (started last visit)   PREVIOUS MEDICATIONS: Lexapro (worsened depression potentially, although primary care notes indicate that he never took the medicine)  ALLERGIES:   Allergies  Allergen Reactions   . Penicillins   . Sulfa Antibiotics     CURRENT MEDICATIONS:  Outpatient Encounter Medications as of 10/08/2020  Medication Sig  . b complex vitamins capsule Take 1 capsule by mouth daily.  . carbidopa-levodopa (SINEMET IR) 25-100 MG tablet TAKE ONE TABLET BY MOUTH THREE TIMES A DAY  . docusate sodium (COLACE) 100 MG capsule Take 1 capsule (100 mg total) by mouth 2 (two) times daily.  Marland Kitchen levothyroxine (SYNTHROID) 50 MCG tablet TAKE ONE TABLET BY MOUTH DAILY BEFORE BREAKFAST  . MULTIPLE VITAMIN PO Take 1 tablet by mouth daily.   . [DISCONTINUED] QUEtiapine (SEROQUEL) 25 MG tablet Take 1 tablet (25 mg total) by mouth at bedtime. (Patient not taking: Reported on 10/08/2020)   No facility-administered encounter medications on file as of 10/08/2020.    Objective:   PHYSICAL EXAMINATION:    VITALS:   Vitals:   10/08/20 0952  BP: 118/72  Pulse: 70  SpO2: 93%  Weight: 168 lb (76.2 kg)  Height: 5\' 10"  (1.778 m)    GEN:  The patient appears stated age and is in NAD. HEENT:  Normocephalic, atraumatic.  The mucous membranes are moist. The superficial temporal arteries are without ropiness or tenderness. CV:  RRR Lungs:  CTAB Neck/HEME:  There are no carotid bruits bilaterally.  Neurological examination:  Orientation: The patient is alert and oriented x3. Cranial nerves: There is good facial symmetry with facial hypomimia. The speech is fluent and hypophonic. Soft palate rises symmetrically and there is  no tongue deviation. Hearing is intact to conversational tone. Sensation: Sensation is intact to light touch throughout Motor: Strength is at least antigravity x4.  Movement examination: Tone: There is nl tone in the UE/LE Abnormal movements: none Coordination:  There is mild decremation with RAM's, L>R Gait and Station: The patient has no difficulty arising out of a deep-seated chair without the use of the hands. When asked to walk in the hall, he actually runs in the hall.    I have  reviewed and interpreted the following labs independently    Chemistry      Component Value Date/Time   NA 139 11/14/2018 0930   K 4.9 11/14/2018 0930   CL 104 11/14/2018 0930   CO2 30 11/14/2018 0930   BUN 21 11/14/2018 0930   CREATININE 1.17 11/14/2018 0930      Component Value Date/Time   CALCIUM 9.5 11/14/2018 0930   ALKPHOS 67 11/08/2017 1051   AST 19 11/08/2017 1051   ALT 7 11/08/2017 1051   BILITOT 0.6 11/08/2017 1051       Lab Results  Component Value Date   WBC 3.9 (L) 11/14/2018   HGB 14.6 11/14/2018   HCT 42.4 11/14/2018   MCV 93.9 11/14/2018   PLT 210.0 11/14/2018    Lab Results  Component Value Date   TSH 3.40 07/02/2020     Total time spent on today's visit was 30 minutes, including both face-to-face time and nonface-to-face time.  Time included that spent on review of records (prior notes available to me/labs/imaging if pertinent), discussing treatment and goals, answering patient's questions and coordinating care.  Cc:  Mliss Sax, MD

## 2020-10-08 ENCOUNTER — Other Ambulatory Visit: Payer: Self-pay

## 2020-10-08 ENCOUNTER — Ambulatory Visit (INDEPENDENT_AMBULATORY_CARE_PROVIDER_SITE_OTHER): Payer: Medicare Other | Admitting: Neurology

## 2020-10-08 ENCOUNTER — Encounter: Payer: Self-pay | Admitting: Neurology

## 2020-10-08 VITALS — BP 118/72 | HR 70 | Ht 70.0 in | Wt 168.0 lb

## 2020-10-08 DIAGNOSIS — G20 Parkinson's disease: Secondary | ICD-10-CM

## 2020-10-08 MED ORDER — CARBIDOPA-LEVODOPA ER 25-100 MG PO TBCR: 1.0000 | EXTENDED_RELEASE_TABLET | Freq: Three times a day (TID) | ORAL | 1 refills | Status: DC

## 2020-10-08 NOTE — Patient Instructions (Signed)
1.  STOP carbidopa/levodopa 25/100 IR (the yellow pill) 2.  START carbidopa/levodopa 25/100 CR, 8am/noon/4pm 3.  Exercise safely.  The physicians and staff at [facility] are committed to providing excellent care. You may receive a survey requesting feedback about your experience at our office. We strive to receive "very good" responses to the survey questions. If you feel that your experience would prevent you from giving the office a "very good " response, please contact our office to try to remedy the situation. We may be reached at [phone]. Thank you for taking the time out of your busy day to complete the survey.

## 2020-10-29 ENCOUNTER — Telehealth: Payer: Self-pay | Admitting: Neurology

## 2020-10-29 NOTE — Telephone Encounter (Signed)
Would he agree to PT? Did this falling start when I changed him from the IR to the CR carbidopa/levodopa?  When I changed him did the daytime sleepiness get better or stay the same?

## 2020-10-29 NOTE — Telephone Encounter (Signed)
Patient's wife called in wanting Dr. Arbutus Leas to know about some changes with the patient. They had gone walking the other day and when he was coming up a hill he ended up falling and needed help getting up. They kept walking and he fell again going uphill. She also mentioned sometimes he has to have help getting out of bed. She would like some advice on something they could do to help with this?

## 2020-12-15 NOTE — Progress Notes (Signed)
Subjective:   Ian Mcknight is a 87 y.o. male who presents for Medicare Annual/Subsequent preventive examination.  Review of Systems     Cardiac Risk Factors include: advanced age (>96men, >47 women)     Objective:    Today's Vitals   12/16/20 1030  BP: 104/60  Pulse: 73  Resp: 16  Temp: 97.8 F (36.6 C)  TempSrc: Temporal  SpO2: 97%  Weight: 159 lb 6.4 oz (72.3 kg)  Height: 5\' 10"  (1.778 m)   Body mass index is 22.87 kg/m.  Advanced Directives 12/16/2020 10/08/2020 03/21/2020 12/12/2019 02/13/2019  Does Patient Have a Medical Advance Directive? No Yes Yes No No  Type of Advance Directive - Healthcare Power of [place];Living will Living will;Healthcare Power of Attorney - -  Would patient like information on creating a medical advance directive? Yes (MAU/Ambulatory/Procedural Areas - Information given) - - No - Patient declined -    Current Medications (verified) Outpatient Encounter Medications as of 12/16/2020  Medication Sig  . b complex vitamins capsule Take 1 capsule by mouth daily.  . Carbidopa-Levodopa ER (SINEMET CR) 25-100 MG tablet controlled release Take 1 tablet by mouth in the morning, at noon, and at bedtime.  . docusate sodium (COLACE) 100 MG capsule Take 1 capsule (100 mg total) by mouth 2 (two) times daily.  02/15/2021 levothyroxine (SYNTHROID) 50 MCG tablet TAKE ONE TABLET BY MOUTH DAILY BEFORE BREAKFAST  . MULTIPLE VITAMIN PO Take 1 tablet by mouth daily.    No facility-administered encounter medications on file as of 12/16/2020.    Allergies (verified) Penicillins and Sulfa antibiotics   History: History reviewed. No pertinent past medical history. Past Surgical History:  Procedure Laterality Date  . EYE SURGERY Bilateral 08/07/2019  . INGUINAL HERNIA REPAIR Right   . PROSTATE SURGERY    . TONSILLECTOMY     Family History  Problem Relation Age of Onset  . Dementia Mother   . Angina Father   . Breast cancer Sister   . Heart disease Sister   . Cancer  Sister   . Other Child        1 with mental retardation/seizures   Social History   Socioeconomic History  . Marital status: Married    Spouse name: Not on file  . Number of children: Not on file  . Years of education: Not on file  . Highest education level: Not on file  Occupational History  . Occupation: retired    Comment: builder/printer  Tobacco Use  . Smoking status: Former Smoker    Quit date: 07/18/1969    Years since quitting: 51.4  . Smokeless tobacco: Never Used  . Tobacco comment: light smoker  Vaping Use  . Vaping Use: Never used  Substance and Sexual Activity  . Alcohol use: No  . Drug use: No  . Sexual activity: Not on file  Other Topics Concern  . Not on file  Social History Narrative  . Not on file   Social Determinants of Health   Financial Resource Strain: Low Risk   . Difficulty of Paying Living Expenses: Not hard at all  Food Insecurity: No Food Insecurity  . Worried About 13/08/1969 in the Last Year: Never true  . Ran Out of Food in the Last Year: Never true  Transportation Needs: No Transportation Needs  . Lack of Transportation (Medical): No  . Lack of Transportation (Non-Medical): No  Physical Activity: Insufficiently Active  . Days of Exercise per Week: 3 days  .  Minutes of Exercise per Session: 20 min  Stress: No Stress Concern Present  . Feeling of Stress : Not at all  Social Connections: Moderately Isolated  . Frequency of Communication with Friends and Family: Once a week  . Frequency of Social Gatherings with Friends and Family: Once a week  . Attends Religious Services: More than 4 times per year  . Active Member of Clubs or Organizations: No  . Attends Banker Meetings: Never  . Marital Status: Married    Tobacco Counseling Counseling given: Not Answered Comment: light smoker   Clinical Intake:  Pre-visit preparation completed: Yes  Pain : No/denies pain     Nutritional Status: BMI of 19-24   Normal Nutritional Risks: None Diabetes: No  How often do you need to have someone help you when you read instructions, pamphlets, or other written materials from your doctor or pharmacy?: 1 - Never  Diabetic?No  Interpreter Needed?: No  Information entered by :: Thomasenia Sales LPN   Activities of Daily Living In your present state of health, do you have any difficulty performing the following activities: 12/16/2020  Hearing? N  Vision? N  Difficulty concentrating or making decisions? Y  Comment sometimes-sees a nuerologist  Walking or climbing stairs? N  Dressing or bathing? Y  Comment wife assists  Doing errands, shopping? Y  Comment wife assists  Quarry manager and eating ? N  Using the Toilet? N  In the past six months, have you accidently leaked urine? Y  Comment occasionally  Do you have problems with loss of bowel control? N  Managing your Medications? N  Managing your Finances? N  Housekeeping or managing your Housekeeping? Y  Some recent data might be hidden    Patient Care Team: Mliss Sax, MD as PCP - General (Family Medicine)  Indicate any recent Medical Services you may have received from other than [facility] providers in the past year (date may be approximate).     Assessment:   This is a routine wellness examination for Ian Mcknight.  Hearing/Vision screen  Hearing Screening   125Hz  250Hz  500Hz  1000Hz  2000Hz  3000Hz  4000Hz  6000Hz  8000Hz   Right ear:           Left ear:           Comments: Pt c/o mild hearing loss  Vision Screening Comments: Reading glasses Last eye exam-2021  Dietary issues and exercise activities discussed: Current Exercise Habits: Home exercise routine, Type of exercise: walking, Time (Minutes): 20, Frequency (Times/Week): 3, Weekly Exercise (Minutes/Week): 60, Exercise limited by: neurologic condition(s) (parkinson's)  Goals    . Increase physical activity      Depression Screen PHQ 2/9 Scores 12/16/2020 12/12/2019  PHQ - 2  Score 1 0    Fall Risk Fall Risk  12/16/2020 10/08/2020 03/21/2020 12/12/2019 07/20/2019  Falls in the past year? 1 1 0 0 0  Number falls in past yr: 1 0 0 0 0  Injury with Fall? 0 0 0 0 0  Risk for fall due to : - - - - -  Follow up Falls prevention discussed - - Education provided;Falls prevention discussed -    FALL RISK PREVENTION PERTAINING TO THE HOME:  Any stairs in or around the home? Yes  If so, are there any without handrails? No  Home free of loose throw rugs in walkways, pet beds, electrical cords, etc? Yes  Adequate lighting in your home to reduce risk of falls? Yes   ASSISTIVE DEVICES UTILIZED TO PREVENT  FALLS:  Life alert? No  Use of a cane, walker or w/c? No  Grab bars in the bathroom? No  pt advised of fall risk Shower chair or bench in shower? No  Elevated toilet seat or a handicapped toilet? No    Patient has had several falls in the past year. Advised patient & wife to consider the use of a cane or rollator walker. Wife thinks this is a good idea but patient states he does not want either at this time.  TIMED UP AND GO:  Was the test performed? Yes .  Length of time to ambulate 10 feet: 15 sec.   Gait slow and steady without use of assistive device  Cognitive Function:Patient currently being treated by a neurologist.   Darrol Angel Cognitive Assessment  07/18/2017  Visuospatial/ Executive (0/5) 4  Naming (0/3) 3  Attention: Read list of digits (0/2) 2  Attention: Read list of letters (0/1) 1  Attention: Serial 7 subtraction starting at 100 (0/3) 3  Language: Repeat phrase (0/2) 2  Language : Fluency (0/1) 0  Abstraction (0/2) 2  Delayed Recall (0/5) 3  Orientation (0/6) 6  Total 26  Adjusted Score (based on education) 26      Immunizations Immunization History  Administered Date(s) Administered  . PFIZER(Purple Top)SARS-COV-2 Vaccination 11/08/2019, 12/04/2019    TDAP status: Due, Education has been provided regarding the importance of this  vaccine. Advised may receive this vaccine at local pharmacy or Health Dept. Aware to provide a copy of the vaccination record if obtained from local pharmacy or Health Dept. Verbalized acceptance and understanding.  Flu Vaccine status: Declined, Education has been provided regarding the importance of this vaccine but patient still declined. Advised may receive this vaccine at local pharmacy or Health Dept. Aware to provide a copy of the vaccination record if obtained from local pharmacy or Health Dept. Verbalized acceptance and understanding.  Pneumococcal vaccine status: Due, Education has been provided regarding the importance of this vaccine. Advised may receive this vaccine at local pharmacy or Health Dept. Aware to provide a copy of the vaccination record if obtained from local pharmacy or Health Dept. Verbalized acceptance and understanding.  Covid-19 vaccine status: Information provided on how to obtain vaccines. Booster due  Qualifies for Shingles Vaccine? Yes   Zostavax completed No   Shingrix Completed?: No.    Education has been provided regarding the importance of this vaccine. Patient has been advised to call insurance company to determine out of pocket expense if they have not yet received this vaccine. Advised may also receive vaccine at local pharmacy or Health Dept. Verbalized acceptance and understanding.  Screening Tests Health Maintenance  Topic Date Due  . TETANUS/TDAP  Never done  . PNA vac Low Risk Adult (1 of 2 - PCV13) Never done  . COVID-19 Vaccine (3 - Booster for Pfizer series) 06/05/2020  . INFLUENZA VACCINE  04/06/2021  . HPV VACCINES  Aged Out    Health Maintenance  Health Maintenance Due  Topic Date Due  . TETANUS/TDAP  Never done  . PNA vac Low Risk Adult (1 of 2 - PCV13) Never done  . COVID-19 Vaccine (3 - Booster for Pfizer series) 06/05/2020    Colorectal cancer screening: No longer required.   Lung Cancer Screening: (Low Dose CT Chest recommended  if Age 5-80 years, 30 pack-year currently smoking OR have quit w/in 15years.) does not qualify.    Additional Screening:  Hepatitis C Screening: does not qualify  Vision Screening: Recommended  annual ophthalmology exams for early detection of glaucoma and other disorders of the eye. Is the patient up to date with their annual eye exam?  Yes  Who is the provider or what is the name of the office in which the patient attends annual eye exams? Pt unsure of name   Dental Screening: Recommended annual dental exams for proper oral hygiene  Community Resource Referral / Chronic Care Management: CRR required this visit?  No   CCM required this visit?  No      Plan:     I have personally reviewed and noted the following in the patient's chart:   . Medical and social history . Use of alcohol, tobacco or illicit drugs  . Current medications and supplements . Functional ability and status . Nutritional status . Physical activity . Advanced directives . List of other physicians . Hospitalizations, surgeries, and ER visits in previous 12 months . Vitals . Screenings to include cognitive, depression, and falls . Referrals and appointments  In addition, I have reviewed and discussed with patient certain preventive protocols, quality metrics, and best practice recommendations. A written personalized care plan for preventive services as well as general preventive health recommendations were provided to patient.     Roanna RaiderMartha A Kinleigh Nault, LPN   1/61/09604/08/2021  Nurse Health Advisor  Nurse Notes: None

## 2020-12-16 ENCOUNTER — Other Ambulatory Visit: Payer: Self-pay

## 2020-12-16 ENCOUNTER — Ambulatory Visit (INDEPENDENT_AMBULATORY_CARE_PROVIDER_SITE_OTHER): Payer: Medicare Other

## 2020-12-16 VITALS — BP 104/60 | HR 73 | Temp 97.8°F | Resp 16 | Ht 70.0 in | Wt 159.4 lb

## 2020-12-16 DIAGNOSIS — Z0000 Encounter for general adult medical examination without abnormal findings: Secondary | ICD-10-CM | POA: Diagnosis not present

## 2020-12-16 NOTE — Patient Instructions (Signed)
[name] , Thank you for taking time to come for your Medicare Wellness Visit. I appreciate your ongoing commitment to your health goals. Please review the following plan we discussed and let me know if I can assist you in the future.   Screening recommendations/referrals: Colonoscopy: No longer required Recommended yearly ophthalmology/optometry visit for glaucoma screening and checkup Recommended yearly dental visit for hygiene and checkup  Vaccinations: Influenza vaccine: Declined Pneumococcal vaccine: Unsure-Discuss with Dr. Doreene Mcknight Tdap vaccine: Discuss with pharmacy Shingles vaccine: Discuss with pharmacy  Covid-19: Booster due  Advanced directives: Information given today  Conditions/risks identified: See problem list  Next appointment: Follow up in one year for your annual wellness visit.   Preventive Care 85 Years and Older, Male Preventive care refers to lifestyle choices and visits with your health care provider that can promote health and wellness. What does preventive care include?  A yearly physical exam. This is also called an annual well check.  Dental exams once or twice a year.  Routine eye exams. Ask your health care provider how often you should have your eyes checked.  Personal lifestyle choices, including:  Daily care of your teeth and gums.  Regular physical activity.  Eating a healthy diet.  Avoiding tobacco and drug use.  Limiting alcohol use.  Practicing safe sex.  Taking low doses of aspirin every day.  Taking vitamin and mineral supplements as recommended by your health care provider. What happens during an annual well check? The services and screenings done by your health care provider during your annual well check will depend on your age, overall health, lifestyle risk factors, and family history of disease. Counseling  Your health care provider may ask you questions about your:  Alcohol use.  Tobacco use.  Drug  use.  Emotional well-being.  Home and relationship well-being.  Sexual activity.  Eating habits.  History of falls.  Memory and ability to understand (cognition).  Work and work Astronomer. Screening  You may have the following tests or measurements:  Height, weight, and BMI.  Blood pressure.  Lipid and cholesterol levels. These may be checked every 5 years, or more frequently if you are over 84 years old.  Skin check.  Lung cancer screening. You may have this screening every year starting at age 45 if you have a 30-pack-year history of smoking and currently smoke or have quit within the past 15 years.  Fecal occult blood test (FOBT) of the stool. You may have this test every year starting at age 57.  Flexible sigmoidoscopy or colonoscopy. You may have a sigmoidoscopy every 5 years or a colonoscopy every 10 years starting at age 15.  Prostate cancer screening. Recommendations will vary depending on your family history and other risks.  Hepatitis C blood test.  Hepatitis B blood test.  Sexually transmitted disease (STD) testing.  Diabetes screening. This is done by checking your blood sugar (glucose) after you have not eaten for a while (fasting). You may have this done every 1-3 years.  Abdominal aortic aneurysm (AAA) screening. You may need this if you are a current or former smoker.  Osteoporosis. You may be screened starting at age 39 if you are at high risk. Talk with your health care provider about your test results, treatment options, and if necessary, the need for more tests. Vaccines  Your health care provider may recommend certain vaccines, such as:  Influenza vaccine. This is recommended every year.  Tetanus, diphtheria, and acellular pertussis (Tdap, Td) vaccine. You may  need a Td booster every 10 years.  Zoster vaccine. You may need this after age 23.  Pneumococcal 13-valent conjugate (PCV13) vaccine. One dose is recommended after age  10.  Pneumococcal polysaccharide (PPSV23) vaccine. One dose is recommended after age 44. Talk to your health care provider about which screenings and vaccines you need and how often you need them. This information is not intended to replace advice given to you by your health care provider. Make sure you discuss any questions you have with your health care provider. Document Released: 09/19/2015 Document Revised: 05/12/2016 Document Reviewed: 06/24/2015 Elsevier Interactive Patient Education  2017 [employer] Prevention in the Home Falls can cause injuries. They can happen to people of all ages. There are many things you can do to make your home safe and to help prevent falls. What can I do on the outside of my home?  Regularly fix the edges of walkways and driveways and fix any cracks.  Remove anything that might make you trip as you walk through a door, such as a raised step or threshold.  Trim any bushes or trees on the path to your home.  Use bright outdoor lighting.  Clear any walking paths of anything that might make someone trip, such as rocks or tools.  Regularly check to see if handrails are loose or broken. Make sure that both sides of any steps have handrails.  Any raised decks and porches should have guardrails on the edges.  Have any leaves, snow, or ice cleared regularly.  Use sand or salt on walking paths during winter.  Clean up any spills in your garage right away. This includes oil or grease spills. What can I do in the bathroom?  Use night lights.  Install grab bars by the toilet and in the tub and shower. Do not use towel bars as grab bars.  Use non-skid mats or decals in the tub or shower.  If you need to sit down in the shower, use a plastic, non-slip stool.  Keep the floor dry. Clean up any water that spills on the floor as soon as it happens.  Remove soap buildup in the tub or shower regularly.  Attach bath mats securely with double-sided  non-slip rug tape.  Do not have throw rugs and other things on the floor that can make you trip. What can I do in the bedroom?  Use night lights.  Make sure that you have a light by your bed that is easy to reach.  Do not use any sheets or blankets that are too big for your bed. They should not hang down onto the floor.  Have a firm chair that has side arms. You can use this for support while you get dressed.  Do not have throw rugs and other things on the floor that can make you trip. What can I do in the kitchen?  Clean up any spills right away.  Avoid walking on wet floors.  Keep items that you use a lot in easy-to-reach places.  If you need to reach something above you, use a strong step stool that has a grab bar.  Keep electrical cords out of the way.  Do not use floor polish or wax that makes floors slippery. If you must use wax, use non-skid floor wax.  Do not have throw rugs and other things on the floor that can make you trip. What can I do with my stairs?  Do not leave any items on  the stairs.  Make sure that there are handrails on both sides of the stairs and use them. Fix handrails that are broken or loose. Make sure that handrails are as long as the stairways.  Check any carpeting to make sure that it is firmly attached to the stairs. Fix any carpet that is loose or worn.  Avoid having throw rugs at the top or bottom of the stairs. If you do have throw rugs, attach them to the floor with carpet tape.  Make sure that you have a light switch at the top of the stairs and the bottom of the stairs. If you do not have them, ask someone to add them for you. What else can I do to help prevent falls?  Wear shoes that:  Do not have high heels.  Have rubber bottoms.  Are comfortable and fit you well.  Are closed at the toe. Do not wear sandals.  If you use a stepladder:  Make sure that it is fully opened. Do not climb a closed stepladder.  Make sure that both  sides of the stepladder are locked into place.  Ask someone to hold it for you, if possible.  Clearly mark and make sure that you can see:  Any grab bars or handrails.  First and last steps.  Where the edge of each step is.  Use tools that help you move around (mobility aids) if they are needed. These include:  Canes.  Walkers.  Scooters.  Crutches.  Turn on the lights when you go into a dark area. Replace any light bulbs as soon as they burn out.  Set up your furniture so you have a clear path. Avoid moving your furniture around.  If any of your floors are uneven, fix them.  If there are any pets around you, be aware of where they are.  Review your medicines with your doctor. Some medicines can make you feel dizzy. This can increase your chance of falling. Ask your doctor what other things that you can do to help prevent falls. This information is not intended to replace advice given to you by your health care provider. Make sure you discuss any questions you have with your health care provider. Document Released: 06/19/2009 Document Revised: 01/29/2016 Document Reviewed: 09/27/2014 Elsevier Interactive Patient Education  2017 [employer].

## 2021-01-14 ENCOUNTER — Telehealth: Payer: Self-pay | Admitting: Neurology

## 2021-01-14 NOTE — Telephone Encounter (Signed)
Patient's wife called in stating the patient came home yesterday from his bike parkinson's workout class and was complaining about not feeling well. Later on that evening he fell twice and is still not walking well. He is currently using a cane that they have.

## 2021-01-14 NOTE — Telephone Encounter (Signed)
Spoke to pt wife seems confused and pt not providing full information. pt wife--stated  not understanding what pt saying. Dr. Arbutus Leas requested to call 911 to check the pt.

## 2021-01-14 NOTE — Telephone Encounter (Signed)
I was contacted by the medical assistant stating that she had talked to the patient's wife and she seemed confused.  She stated that she could not understand the patient when she asked the questions that the medical assistant was trying to retrieve the answers for her.  The wife could not state if this was his baseline speech or if it was new.  She was able to state that he had started using a walker, which was new.  She also mentioned something about drooling, but my medical assistant was not able to get from her if this was new or something chronic with his Parkinson's.  I reviewed the patient's chart, and he previously actually ran in my call.  He also did not have slurred speech in the past (although we could not get from her whether his speech was slurred or just hypophonic).  Ultimately, we made the decision to go ahead and call 911 and have a well check.  We did let the patient's wife know that we were doing this and while she did not think it was necessary, she also was not able to provide the information that we needed, and was a bit tangential (talking about the patient's constipation).

## 2021-01-14 NOTE — Telephone Encounter (Signed)
What does "not feeling well" mean?  Weak or numb on ONE side of body more than other?  If yes, go to ER.  If no and just generally weak, he needs to make a f/u today with PCP

## 2021-01-22 ENCOUNTER — Other Ambulatory Visit: Payer: Self-pay | Admitting: *Deleted

## 2021-01-22 NOTE — Patient Outreach (Signed)
Member screened for potential [facility] care coordination needs. [name] resides in [facility].   Update received from [facility] SW reporting member recently admitted to SNF. Anticipated transition plan is to return home with spouse.   Will continue to follow while [name] is in SNF.    Raiford Noble, MSN, RN,BSN [facility] Post Acute Care Coordinator [phone] [facility]) [phone]  (Toll free office)

## 2021-02-10 ENCOUNTER — Other Ambulatory Visit: Payer: Self-pay | Admitting: *Deleted

## 2021-02-10 NOTE — Patient Outreach (Signed)
[facility] Post-Acute Care Coordinator follow up.  Verified in [facility] (Patient Ian Mcknight) that member transitioned home from [facility] on 02/05/21.   Telephone call made to [name] [phone]. No answer. HIPAA compliant voicemail message left to request return call.    Raiford Noble, MSN, RN,BSN [facility] Post Acute Care Coordinator [phone] [facility]) [phone]  (Toll free office)

## 2021-02-12 ENCOUNTER — Telehealth: Payer: Self-pay | Admitting: Family Medicine

## 2021-02-12 NOTE — Telephone Encounter (Signed)
Verbal order given  

## 2021-02-12 NOTE — Telephone Encounter (Signed)
Returned call no answer LMTCB 

## 2021-02-12 NOTE — Telephone Encounter (Signed)
[facility] is calling to get verbal orders on patient. She needs the frequency to be 2 week 1 and 1 week 2 to address drooling and muscles in patients lips. Please call her back [phone].

## 2021-02-25 ENCOUNTER — Telehealth: Payer: Self-pay | Admitting: Family Medicine

## 2021-02-25 NOTE — Telephone Encounter (Signed)
Sonal from [facility] is wanting orders for pt that state he is to get-speech therapy 2times a week for 2weeks. He already has 1time a week for 2weeks. She saw him yesterday(02/24/21) and his speech is not where it should be at. Please advise Sonal at [phone].

## 2021-02-26 NOTE — Telephone Encounter (Signed)
Verbal given, written order will be faxed over  

## 2021-02-26 NOTE — Telephone Encounter (Signed)
Sonal[facility] is calling back regarding verbal orders. Please call her back at [phone].

## 2021-03-06 ENCOUNTER — Telehealth: Payer: Self-pay | Admitting: Family Medicine

## 2021-03-06 NOTE — Telephone Encounter (Signed)
FYI:I received a call from one of pt's therapist. Ian Mcknight had a bp of 88/62, he was very drowsy and hard to keep awake. Once the therapist walked him around his bp was 97/70. I transferred the therapist(who was with pt) over to Nurse Triage.

## 2021-03-10 ENCOUNTER — Telehealth: Payer: Self-pay

## 2021-03-10 ENCOUNTER — Telehealth: Payer: Self-pay | Admitting: Neurology

## 2021-03-10 NOTE — Telephone Encounter (Signed)
Patient seen at ER

## 2021-03-10 NOTE — Telephone Encounter (Signed)
Called patients wife back. Advised her pt shoud go back to ER. Dr Ma Hillock next available appt is 03/12/21. They decided to make an appt but agreed to go to ER if they feel like its necessary.

## 2021-03-10 NOTE — Telephone Encounter (Signed)
Pt was advised on 03/06/21 to go to ER for lbp readings. Everything checked out ok and ER Dr advised following up PCP asap. Over the weekend, his bp readings were as low as 91/61, 89/56. They're asking to see Dr Doreene Burke before Thursday (his 1st available) or do they need to go back to ER?  Please advise

## 2021-03-10 NOTE — Telephone Encounter (Signed)
If readings are still low patient will need to go back to ER due to next available appointment for Dr. Doreene Burke. Or if one of the other Providers have anything patient could see them for follow up if they are okay with seeing another Provider.

## 2021-03-12 ENCOUNTER — Ambulatory Visit (INDEPENDENT_AMBULATORY_CARE_PROVIDER_SITE_OTHER): Payer: Medicare Other | Admitting: Family Medicine

## 2021-03-12 ENCOUNTER — Other Ambulatory Visit: Payer: Self-pay

## 2021-03-12 ENCOUNTER — Telehealth: Payer: Self-pay | Admitting: Family Medicine

## 2021-03-12 ENCOUNTER — Encounter: Payer: Self-pay | Admitting: Family Medicine

## 2021-03-12 VITALS — BP 98/60 | HR 57 | Temp 97.8°F | Ht 70.0 in | Wt 154.4 lb

## 2021-03-12 DIAGNOSIS — R001 Bradycardia, unspecified: Secondary | ICD-10-CM | POA: Diagnosis not present

## 2021-03-12 DIAGNOSIS — I951 Orthostatic hypotension: Secondary | ICD-10-CM | POA: Insufficient documentation

## 2021-03-12 DIAGNOSIS — T887XXA Unspecified adverse effect of drug or medicament, initial encounter: Secondary | ICD-10-CM

## 2021-03-12 DIAGNOSIS — I959 Hypotension, unspecified: Secondary | ICD-10-CM | POA: Diagnosis not present

## 2021-03-12 DIAGNOSIS — E860 Dehydration: Secondary | ICD-10-CM

## 2021-03-12 DIAGNOSIS — E039 Hypothyroidism, unspecified: Secondary | ICD-10-CM

## 2021-03-12 NOTE — Telephone Encounter (Signed)
Ian Mcknight from [facility] is wanting a new verbal order to start PT of 1 time a week for 3 weeks. The pt has just had an ER visit. A verbal order can be left on his vm at [phone].

## 2021-03-12 NOTE — Progress Notes (Signed)
Established Patient Office Visit  Subjective:  Patient ID: Ian Mcknight, male    DOB: 1934-07-06  Age: 87 y.o. MRN: [id number]  CC:  Chief Complaint  Patient presents with   Hospitalization Follow-up    Hospital follow up seen for hypotension    HPI Ian Mcknight presents for emergency room visit follow-up.  Seen for weakness.  Blood pressure was measured at 147/91.  Hemoglobin was 13.2 specific gravity of urine was 0.024.  EKG showed normal sinus rhythm with occasional PVCs.  Wife accompanies him today.  She brings in a list of blood pressures running from 90-140/50-70s.  Most of the blood pressures have been in the less than 110 systolic range.  Patient's physical therapist has expressed concern about his low blood pressure.  Patient is found it difficult to maintain his hydration.  He is also on levodopa for Parkinson's disease.  No past medical history on file.  Past Surgical History:  Procedure Laterality Date   EYE SURGERY Bilateral 08/07/2019   INGUINAL HERNIA REPAIR Right    PROSTATE SURGERY     TONSILLECTOMY      Family History  Problem Relation Age of Onset   Dementia Mother    Angina Father    Breast cancer Sister    Heart disease Sister    Cancer Sister    Other Child        1 with mental retardation/seizures    Social History   Socioeconomic History   Marital status: Married    Spouse name: Not on file   Number of children: Not on file   Years of education: Not on file   Highest education level: Not on file  Occupational History   Occupation: retired    Comment: builder/printer  Tobacco Use   Smoking status: Former    Pack years: 0.00    Types: Cigarettes    Quit date: 07/18/1969    Years since quitting: 51.6   Smokeless tobacco: Never   Tobacco comments:    light smoker  Vaping Use   Vaping Use: Never used  Substance and Sexual Activity   Alcohol use: No   Drug use: No   Sexual activity: Not on file  Other Topics Concern   Not on file  Social  History Narrative   Not on file   Social Determinants of Health   Financial Resource Strain: Low Risk    Difficulty of Paying Living Expenses: Not hard at all  Food Insecurity: No Food Insecurity   Worried About Programme researcher, broadcasting/film/video in the Last Year: Never true   Ran Out of Food in the Last Year: Never true  Transportation Needs: No Transportation Needs   Lack of Transportation (Medical): No   Lack of Transportation (Non-Medical): No  Physical Activity: Insufficiently Active   Days of Exercise per Week: 3 days   Minutes of Exercise per Session: 20 min  Stress: No Stress Concern Present   Feeling of Stress : Not at all  Social Connections: Moderately Isolated   Frequency of Communication with Friends and Family: Once a week   Frequency of Social Gatherings with Friends and Family: Once a week   Attends Religious Services: More than 4 times per year   Active Member of [employer] or Organizations: No   Attends Banker Meetings: Never   Marital Status: Married  Catering manager Violence: Not At Risk   Fear of Current or Ex-Partner: No   Emotionally Abused: No   Physically Abused: No  Sexually Abused: No    Outpatient Medications Prior to Visit  Medication Sig Dispense Refill   b complex vitamins capsule Take 1 capsule by mouth daily. 100 capsule 1   Carbidopa-Levodopa ER (SINEMET CR) 25-100 MG tablet controlled release Take 1 tablet by mouth in the morning, at noon, and at bedtime. 270 tablet 1   docusate sodium (COLACE) 100 MG capsule Take 1 capsule (100 mg total) by mouth 2 (two) times daily. 180 capsule 4   levothyroxine (SYNTHROID) 50 MCG tablet TAKE ONE TABLET BY MOUTH DAILY BEFORE BREAKFAST 90 tablet 2   MULTIPLE VITAMIN PO Take 1 tablet by mouth daily.      No facility-administered medications prior to visit.    Allergies  Allergen Reactions   Penicillins    Sulfa Antibiotics     ROS Review of Systems  Constitutional:  Positive for unexpected weight  change. Negative for chills, diaphoresis, fatigue and fever.  HENT: Negative.    Respiratory:  Negative for chest tightness and shortness of breath.   Cardiovascular:  Negative for chest pain.  Gastrointestinal:  Negative for abdominal pain.  Endocrine: Negative for polyphagia and polyuria.  Genitourinary:  Negative for difficulty urinating, frequency and urgency.  Skin: Negative.   Neurological:  Positive for weakness. Negative for speech difficulty.     Objective:    Physical Exam Vitals and nursing note reviewed.  Constitutional:      General: He is not in acute distress.    Appearance: Normal appearance. He is normal weight. He is not ill-appearing, toxic-appearing or diaphoretic.  HENT:     Head: Normocephalic and atraumatic.     Right Ear: External ear normal.     Left Ear: External ear normal.     Mouth/Throat:     Mouth: Mucous membranes are moist.     Pharynx: Oropharynx is clear. No oropharyngeal exudate or posterior oropharyngeal erythema.  Eyes:     General: No scleral icterus.       Right eye: No discharge.        Left eye: No discharge.     Extraocular Movements: Extraocular movements intact.     Conjunctiva/sclera: Conjunctivae normal.     Pupils: Pupils are equal, round, and reactive to light.  Cardiovascular:     Rate and Rhythm: Bradycardia present. Occasional Extrasystoles are present. Pulmonary:     Effort: Pulmonary effort is normal.     Breath sounds: Normal breath sounds.  Abdominal:     General: Bowel sounds are normal.  Musculoskeletal:     Cervical back: No rigidity or tenderness.  Lymphadenopathy:     Cervical: No cervical adenopathy.  Skin:    Comments: Positive tenting.  Neurological:     Mental Status: He is alert.    BP 98/60   Pulse (!) 57   Temp 97.8 F (36.6 C) (Temporal)   Ht 5\' 10"  (1.778 m)   Wt 154 lb 6.4 oz (70 kg)   SpO2 96%   BMI 22.15 kg/m  Wt Readings from Last 3 Encounters:  03/12/21 154 lb 6.4 oz (70 kg)   12/16/20 159 lb 6.4 oz (72.3 kg)  10/08/20 168 lb (76.2 kg)     Health Maintenance Due  Topic Date Due   TETANUS/TDAP  Never done   Zoster Vaccines- Shingrix (1 of 2) Never done   PNA vac Low Risk Adult (1 of 2 - PCV13) Never done    There are no preventive care reminders to display for this patient.  Lab Results  Component Value Date   TSH 3.40 07/02/2020   Lab Results  Component Value Date   WBC 3.9 (L) 11/14/2018   HGB 14.6 11/14/2018   HCT 42.4 11/14/2018   MCV 93.9 11/14/2018   PLT 210.0 11/14/2018   Lab Results  Component Value Date   NA 139 11/14/2018   K 4.9 11/14/2018   CO2 30 11/14/2018   GLUCOSE 102 (H) 11/14/2018   BUN 21 11/14/2018   CREATININE 1.17 11/14/2018   BILITOT 0.6 11/08/2017   ALKPHOS 67 11/08/2017   AST 19 11/08/2017   ALT 7 11/08/2017   PROT 6.9 11/08/2017   ALBUMIN 4.1 11/08/2017   CALCIUM 9.5 11/14/2018   GFR 59.56 (L) 11/14/2018   Lab Results  Component Value Date   CHOL 125 11/08/2017   Lab Results  Component Value Date   HDL 54.10 11/08/2017   Lab Results  Component Value Date   LDLCALC 62 11/08/2017   Lab Results  Component Value Date   TRIG 47.0 11/08/2017   Lab Results  Component Value Date   CHOLHDL 2 11/08/2017   No results found for: HGBA1C    Assessment & Plan:   Problem List Items Addressed This Visit       Cardiovascular and Mediastinum   Hypotension   Relevant Orders   Ambulatory referral to Cardiology     Endocrine   Hypothyroidism - Primary     Other   Medication side effect   Dehydration   Bradycardia   Relevant Orders   Ambulatory referral to Cardiology    No orders of the defined types were placed in this encounter.   Follow-up: Return in about 6 weeks (around 04/23/2021), or with wife..  Patient is suffering from hypotension and bradycardia.  Regarding hypotension do believe that chronic dehydration is playing a part.  Also question medication side effect from levodopa.   Bradycardia with ectopy could be an entirely different matter.  Wife is helping patient to consume more fluids.  He has follow-up scheduled with his neurologist.  I will attempt to directly contact her.  Have asked for cardiology consultation regarding hypotension, bradycardia and ectopy. There has been weight loss. Will follow up on that next visit. History of depression?  Unable to contact Dr. Arbutus Leas directly today.   Mliss Sax, MD

## 2021-03-13 NOTE — Telephone Encounter (Signed)
Verbal order received -

## 2021-03-19 ENCOUNTER — Telehealth: Payer: Self-pay | Admitting: Neurology

## 2021-03-19 NOTE — Telephone Encounter (Signed)
Spoke with pt wife she stated pt went to the ER with low BP and heart rate they said everything checked out normal his medication must be causing his drop in BP and heart rate. Pt had seen his PCP in Thurs and has an appointment with heart Dr in Aug, he has not taken his carbidopa-levodopa in 3 days,and BP and heart rate is normal. His wife stated his parkinson is doing ok at this time asking what should they do? Pt wife advised that Dr Tat is out of the office

## 2021-03-20 NOTE — Telephone Encounter (Deleted)
While levodopa certainly can cause decrease BP (as can Parkinsons Disease itself), it doesn't drop heart rate.  He can certainly hold the carbidopa/levodopa until he sees cardiology if he wants.  My guess is the disease dropped the BP (made worse by the med perhaps).

## 2021-03-20 NOTE — Telephone Encounter (Signed)
While levodopa certainly can decrease BP (as can Parkinsons Disease ), it doesn't drop heart rate.  They can hold it if they want until the cardiology visit but my guess is the disease is causing low blood pressure (and could be worsened by the med)

## 2021-03-20 NOTE — Telephone Encounter (Signed)
Called patients wife and informed her per Dr. Arbutus Leas "While levodopa certainly can decrease BP (as can Parkinsons Disease ), it doesn't drop heart rate.  They can hold it if they want until the cardiology visit but my guess is the disease is causing low blood pressure (and could be worsened by the med)."  Patients wife verbalized understanding and had no questions or concerns.

## 2021-03-30 ENCOUNTER — Telehealth: Payer: Self-pay | Admitting: Family Medicine

## 2021-03-30 NOTE — Telephone Encounter (Signed)
[facility] home [facility] calling and needs Physical thearapy orders for 1 more week and then he will be dicharged. Please advise Physical Therapist Liji"s # [phone]

## 2021-03-31 NOTE — Progress Notes (Signed)
Assessment/Plan:   1.  Parkinsons Disease  -I think that part of the issue is that he is currently off of medication and therefore markedly undertreated.  -they are worried about restarting medication because of orthostasis.  Did decide to hold off on levodopa, but will likely start again next visit after he sees cardiology.  He is scheduled to see cardiology on August 9.  I will plan on seeing him back on August 10.  2.  Depression  -Primary care treating.  Patient given several medications but has not taken them.  3.  Nocturia  -Following with urology.  4.  Parkinsons Disease hallucinations  -Still having these off of levodopa, but are somewhat better.  We will consider Nuplazid in the future.  5.  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  He was not particularly orthostatic in the office today.  6.  Dysarthria  -Patient is a lot more dysarthric than I remember in the past.  This certainly can be from Parkinson's disease, but I am going to go ahead and check an MRI of the brain to make sure we are not missing anything else, especially since he seemed to decline rather acutely according to his wife.   Subjective:   Ian Mcknight was seen today in follow up for Parkinsons disease.  My previous records were reviewed prior to todays visit as well as outside records available to me.  Last visit, the patient thought levodopa was making him sleepy and we switched him to the CR version.  Wife states that she was not sure that it changed for sleepiness, although patient states that he thought it did.  Wife did call in May stating that the patient did not feel well, but when we called back to retrieve information about exactly what that meant, the wife actually seemed confused.  She stated that the patient was drooling and having to use a walker and his speech was off but she couldn't answer questions about how speech was off (slurred vs hypophonic) and if  drooling was baseline.  Because she could not answer questions well, we ended up calling 911 and doing a well check.  We never got information back about what happened.  Looking in care everywhere, he was admitted to [facility] on May 11 (that same day).  CT brain was reported to be nonacute.  Patient admitted because of falls.  No urinary tract infection.  Ultimately discharged to subacute nursing care.  It is unclear what initiated the event.  Was back in the emergency room on July 1 with generalized weakness and was identified with what they called "asymptomatic hypotension."  Blood pressure was in the 80s/60s.  No medication changes were made.  Patient was back in the emergency room July 14.  Apparently, patient has been keeping a blood pressure log and noted that he is heart rate was 38.  Heart rate was normal in the emergency room and he was discharged home.  They ended up calling here and asking about low blood pressure and heart rate.  We told them while levodopa can certainly decrease blood pressure, it does not drop heart rate.  I told them that they could hold levodopa if they wanted to until the cardiology visit, but my best guess was that Parkinson's disease was lowering his blood pressure.  He has a cardiology visit in August.  He is off of carbidopa/levodopa now because of this.  She has learned that the manual  BP cuff wasn't always reading the pulse correctly.  Still having some hallucinations but not bad.    Wife relates an episode 2 weeks ago where she could not find pt.  Pt drove off and got out of car and fell in someones yard and they called 911.    Current prescribed movement disorder medications: Carbidopa/levodopa 25/100 CR, 1 tablet 3 times per day (holding it).      PREVIOUS MEDICATIONS: Lexapro (worsened depression potentially, although primary care notes indicate that he never took the medicine); carbidopa/levodopa 25/100 IR (changed to CR because of sleepiness  ALLERGIES:    Allergies  Allergen Reactions   Penicillins    Sulfa Antibiotics     CURRENT MEDICATIONS:  Outpatient Encounter Medications as of 04/01/2021  Medication Sig   b complex vitamins capsule Take 1 capsule by mouth daily.   docusate sodium (COLACE) 100 MG capsule Take 1 capsule (100 mg total) by mouth 2 (two) times daily.   levothyroxine (SYNTHROID) 50 MCG tablet TAKE ONE TABLET BY MOUTH DAILY BEFORE BREAKFAST   MULTIPLE VITAMIN PO Take 1 tablet by mouth daily.    Carbidopa-Levodopa ER (SINEMET CR) 25-100 MG tablet controlled release Take 1 tablet by mouth in the morning, at noon, and at bedtime. (Patient not taking: Reported on 04/01/2021)   No facility-administered encounter medications on file as of 04/01/2021.    Objective:   PHYSICAL EXAMINATION:    VITALS:   Vitals:   04/01/21 0941  BP: 118/72  Pulse: 71  SpO2: 97%  Weight: 149 lb (67.6 kg)  Height: 5\' 10"  (1.778 m)   Orthostatic VS for the past 72 hrs (Last 3 readings):  Orthostatic BP Patient Position BP Location Orthostatic Pulse  04/01/21 1142 100/60 Standing Right Arm 70  04/01/21 1141 102/62 Sitting Right Arm 54  04/01/21 1140 110/60 Supine Right Arm 60      GEN:  The patient appears stated age and is in NAD. HEENT:  Normocephalic, atraumatic.  The mucous membranes are moist. The superficial temporal arteries are without ropiness or tenderness. CV:  RRR Lungs:  CTAB Neck/HEME:  There are no carotid bruits bilaterally.  Neurological examination:  Orientation: The patient is alert and oriented x3.  He is able to answer questions appropriately, although he is slow with answers. Cranial nerves: There is good facial symmetry with facial hypomimia. The speech is fluent and hypophonic and markedly dysarthric. Soft palate rises symmetrically and there is no tongue deviation. Hearing is intact to conversational tone. Sensation: Sensation is intact to light touch throughout Motor: Strength is at least antigravity  x4.  Movement examination: Tone: There is slight increased tone in the left upper extremity. Abnormal movements: none Coordination:  There is decremation with RAM's, L>R Gait and Station: The patient is in a transport chair.  He is given a walker.  He actually walks quite well in the hall with a walker.  Without the walker, he is also able to walk.  He slightly drags the left leg.  I have reviewed and interpreted the following labs independently    Chemistry      Component Value Date/Time   NA 139 11/14/2018 0930   K 4.9 11/14/2018 0930   CL 104 11/14/2018 0930   CO2 30 11/14/2018 0930   BUN 21 11/14/2018 0930   CREATININE 1.17 11/14/2018 0930      Component Value Date/Time   CALCIUM 9.5 11/14/2018 0930   ALKPHOS 67 11/08/2017 1051   AST 19 11/08/2017 1051  ALT 7 11/08/2017 1051   BILITOT 0.6 11/08/2017 1051       Lab Results  Component Value Date   WBC 3.9 (L) 11/14/2018   HGB 14.6 11/14/2018   HCT 42.4 11/14/2018   MCV 93.9 11/14/2018   PLT 210.0 11/14/2018    Lab Results  Component Value Date   TSH 3.40 07/02/2020     Total time spent on today's visit was 45 minutes, including both face-to-face time and nonface-to-face time.  Time included that spent on review of records (prior notes available to me/labs/imaging if pertinent), discussing treatment and goals, answering patient's questions and coordinating care.  Cc:  Mliss Sax, MD

## 2021-04-01 ENCOUNTER — Ambulatory Visit (INDEPENDENT_AMBULATORY_CARE_PROVIDER_SITE_OTHER): Payer: Medicare Other | Admitting: Neurology

## 2021-04-01 ENCOUNTER — Encounter: Payer: Self-pay | Admitting: Neurology

## 2021-04-01 ENCOUNTER — Other Ambulatory Visit: Payer: Self-pay

## 2021-04-01 VITALS — BP 118/72 | HR 71 | Ht 70.0 in | Wt 149.0 lb

## 2021-04-01 DIAGNOSIS — R531 Weakness: Secondary | ICD-10-CM | POA: Diagnosis not present

## 2021-04-01 DIAGNOSIS — G20 Parkinson's disease: Secondary | ICD-10-CM

## 2021-04-01 DIAGNOSIS — R471 Dysarthria and anarthria: Secondary | ICD-10-CM

## 2021-04-01 NOTE — Telephone Encounter (Signed)
Spoke with Liji at [facility] PT per Liji they need discharge orders to discharge patient from PT. Okay to give verbal order to discharge? Please advise.

## 2021-04-02 NOTE — Telephone Encounter (Signed)
Ian Mcknight verbally  understood it is okay to discharge patient from PT

## 2021-04-11 ENCOUNTER — Ambulatory Visit (HOSPITAL_BASED_OUTPATIENT_CLINIC_OR_DEPARTMENT_OTHER)
Admission: RE | Admit: 2021-04-11 | Discharge: 2021-04-11 | Disposition: A | Discharge status: Home / Self Care | Payer: Medicare Other | Source: Ambulatory Visit | Attending: Neurology | Admitting: Neurology

## 2021-04-11 ENCOUNTER — Other Ambulatory Visit: Payer: Self-pay

## 2021-04-11 DIAGNOSIS — R531 Weakness: Secondary | ICD-10-CM | POA: Diagnosis present

## 2021-04-11 DIAGNOSIS — R471 Dysarthria and anarthria: Secondary | ICD-10-CM | POA: Diagnosis not present

## 2021-04-13 NOTE — Progress Notes (Addendum)
Assessment/Plan:   1.  Parkinsons Disease  -I think that the main issue last visit in terms of more trouble walking was that he was off of levodopa (still is)  -restart levodopa.  Will use carbidopa/levodopa 25/100 CR, 1 po tid.  Monitor BP as its already low  -discussed with patient when he is not in [facility] and he is up walking, he needs to use walker at all times.  He has small doorways and needs smaller walker.  Discussed that 3 wheeled walker may be smaller   2.  Depression  -Primary care treating.  Patient given several medications but has not taken them.  3.  Nocturia  -Following with urology.  4.  Parkinsons Disease hallucinations  -Still having these off of levodopa, but are somewhat better.  We will consider Nuplazid in the future.  5.  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  Going to send cardiologist a message about his BP today, in context that I would like to start levodopa back and probably will need midodrine ( ADDENDUM:  spoke with cardiologist and he wants to hold on midodrine and try to increase water/liberalize salt first - discussed those things with patient yesterday as well)  -awaiting zio patch for bradycardia (by pt hx)      Subjective:   Ian Mcknight was seen today in follow up for Parkinsons disease.  My previous records were reviewed prior to todays visit as well as outside records available to me.  Last visit, the patient was off of levodopa because he thought it was causing low pulse and low blood pressure.  We did talk about the fact that it could cause low blood pressure, but generally not low pulse.  Nonetheless, we left him off of it until his cardiology appointment, which was yesterday.  Records reviewed.  Zio patch and echo ordered.  No new meds ordered but midodrine/northera discussed.   We also scheduled an MRI of the brain because I thought that the patient had gotten more dysarthric and had clinically declined some  (although that could have been because he was off of levodopa).  MRI of the brain was completed on August 6.  I personally reviewed that.  Diffusion-weighted imaging was negative.  There is mild to moderate small vessel disease.   Had a fall over a week ago - wasn't using walker or cane - and just went down.  Fell 2 days later at place to get car inspected.     Current prescribed movement disorder medications: Carbidopa/levodopa 25/100 CR, 1 tablet 3 times per day (holding it).      PREVIOUS MEDICATIONS: Lexapro (worsened depression potentially, although primary care notes indicate that he never took the medicine); carbidopa/levodopa 25/100 IR (changed to CR because of sleepiness  ALLERGIES:   Allergies  Allergen Reactions   Penicillins    Sulfa Antibiotics     CURRENT MEDICATIONS:  Outpatient Encounter Medications as of 04/15/2021  Medication Sig   b complex vitamins capsule Take 1 capsule by mouth daily.   docusate sodium (COLACE) 100 MG capsule Take 1 capsule (100 mg total) by mouth 2 (two) times daily.   levothyroxine (SYNTHROID) 50 MCG tablet TAKE ONE TABLET BY MOUTH DAILY BEFORE BREAKFAST (Patient taking differently: Take 50 mcg by mouth daily before breakfast.)   MULTIPLE VITAMIN PO Take 1 tablet by mouth daily. Unknown strenght   Carbidopa-Levodopa ER (SINEMET CR) 25-100 MG tablet controlled release Take 1 tablet by mouth in the morning, at  noon, and at bedtime. (Patient not taking: No sig reported)   No facility-administered encounter medications on file as of 04/15/2021.    Objective:   PHYSICAL EXAMINATION:    VITALS:   Vitals:   04/15/21 0914  BP: (!) 108/58  Pulse: 62  SpO2: (!) 62%  Weight: 148 lb (67.1 kg)  Height: 5\' 10"  (1.778 m)    No data found.     GEN:  The patient appears stated age and is in NAD. HEENT:  Normocephalic, atraumatic.  The mucous membranes are moist. The superficial temporal arteries are without ropiness or tenderness. CV:   RRR Lungs:  CTAB Neck/HEME:  There are no carotid bruits bilaterally.  Neurological examination:  Orientation: The patient is alert and oriented x3.  He is able to answer questions appropriately, although he is slow with answers. Cranial nerves: There is good facial symmetry with facial hypomimia. The speech is fluent and hypophonic and markedly dysarthric. Soft palate rises symmetrically and there is no tongue deviation. Hearing is intact to conversational tone. Sensation: Sensation is intact to light touch throughout Motor: Strength is at least antigravity x4.  Movement examination: Tone: There is slight increased tone in the bilateral UE. Abnormal movements: none Coordination:  There is slowness of RAMs Gait and Station: The patient is in a transport chair.  He uses the examiner as a walker. He requires assist out of the chair.  He is slow and unstable.  He freezes.  He turns en bloc  I have reviewed and interpreted the following labs independently    Chemistry      Component Value Date/Time   NA 139 11/14/2018 0930   K 4.9 11/14/2018 0930   CL 104 11/14/2018 0930   CO2 30 11/14/2018 0930   BUN 21 11/14/2018 0930   CREATININE 1.17 11/14/2018 0930      Component Value Date/Time   CALCIUM 9.5 11/14/2018 0930   ALKPHOS 67 11/08/2017 1051   AST 19 11/08/2017 1051   ALT 7 11/08/2017 1051   BILITOT 0.6 11/08/2017 1051       Lab Results  Component Value Date   WBC 3.9 (L) 11/14/2018   HGB 14.6 11/14/2018   HCT 42.4 11/14/2018   MCV 93.9 11/14/2018   PLT 210.0 11/14/2018    Lab Results  Component Value Date   TSH 3.40 07/02/2020     Total time spent on today's visit was 41 minutes, including both face-to-face time and nonface-to-face time.  Time included that spent on review of records (prior notes available to me/labs/imaging if pertinent), discussing treatment and goals, answering patient's questions and coordinating care.  Cc:  07/04/2020, MD

## 2021-04-14 ENCOUNTER — Ambulatory Visit (INDEPENDENT_AMBULATORY_CARE_PROVIDER_SITE_OTHER): Payer: Medicare Other

## 2021-04-14 ENCOUNTER — Encounter: Payer: Self-pay | Admitting: Cardiology

## 2021-04-14 ENCOUNTER — Ambulatory Visit (INDEPENDENT_AMBULATORY_CARE_PROVIDER_SITE_OTHER): Payer: Medicare Other | Admitting: Cardiology

## 2021-04-14 ENCOUNTER — Other Ambulatory Visit: Payer: Self-pay

## 2021-04-14 VITALS — BP 108/56 | HR 63 | Ht 70.0 in | Wt 148.0 lb

## 2021-04-14 DIAGNOSIS — R001 Bradycardia, unspecified: Secondary | ICD-10-CM

## 2021-04-14 DIAGNOSIS — I493 Ventricular premature depolarization: Secondary | ICD-10-CM | POA: Diagnosis not present

## 2021-04-14 DIAGNOSIS — R011 Cardiac murmur, unspecified: Secondary | ICD-10-CM

## 2021-04-14 DIAGNOSIS — I951 Orthostatic hypotension: Secondary | ICD-10-CM

## 2021-04-14 DIAGNOSIS — G20 Parkinson's disease: Secondary | ICD-10-CM | POA: Diagnosis not present

## 2021-04-14 NOTE — Progress Notes (Signed)
Cardiology Consultation:    Date:  04/14/2021   ID:  Ian Mcknight, DOB Oct 15, 1933, MRN [id number]  PCP:  Mliss Sax, MD  Cardiologist:  Gypsy Balsam, MD   Referring MD: Mliss Sax   No chief complaint on file. Blood pressure and bradycardia  History of Present Illness:    Ian Mcknight is a 87 y.o. male who is being seen today for the evaluation of low blood pressure and bradycardia at the request of Mliss Sax,*.  He is a nice gentleman who developed Parkinson about 3 years ago.  It looks like he also is hard of hearing as well as does have what appears to be progressive dementia.  In the room and talking to his delightful wife that being married to him for more than 60 years.  He was brought to me because he does have episode of hypotension and bradycardia.  I cannot get any history from him he does not respond to my voice comments.  She tells me that sometimes when the check blood pressure will be low and then but over 90 sometimes his heart rate will be low in the neighborhood of 40.  She denies him having any episode of passing out difficult to assess if he got having dizziness.  He sits in the chair all day.  There is no history of heart problem.  History reviewed. No pertinent past medical history.  Past Surgical History:  Procedure Laterality Date   EYE SURGERY Bilateral 08/07/2019   INGUINAL HERNIA REPAIR Right    PROSTATE SURGERY     TONSILLECTOMY      Current Medications: Current Meds  Medication Sig   b complex vitamins capsule Take 1 capsule by mouth daily.   docusate sodium (COLACE) 100 MG capsule Take 1 capsule (100 mg total) by mouth 2 (two) times daily.   levothyroxine (SYNTHROID) 50 MCG tablet TAKE ONE TABLET BY MOUTH DAILY BEFORE BREAKFAST (Patient taking differently: Take 50 mcg by mouth daily before breakfast.)     Allergies:   Penicillins and Sulfa antibiotics   Social History   Socioeconomic History   Marital status:  Married    Spouse name: Not on file   Number of children: Not on file   Years of education: Not on file   Highest education level: Not on file  Occupational History   Occupation: retired    Comment: builder/printer  Tobacco Use   Smoking status: Former    Types: Cigarettes    Quit date: 07/18/1969    Years since quitting: 51.7   Smokeless tobacco: Never   Tobacco comments:    light smoker  Vaping Use   Vaping Use: Never used  Substance and Sexual Activity   Alcohol use: No   Drug use: No   Sexual activity: Not on file  Other Topics Concern   Not on file  Social History Narrative   Not on file   Social Determinants of Health   Financial Resource Strain: Low Risk    Difficulty of Paying Living Expenses: Not hard at all  Food Insecurity: No Food Insecurity   Worried About Programme researcher, broadcasting/film/video in the Last Year: Never true   Ran Out of Food in the Last Year: Never true  Transportation Needs: No Transportation Needs   Lack of Transportation (Medical): No   Lack of Transportation (Non-Medical): No  Physical Activity: Insufficiently Active   Days of Exercise per Week: 3 days   Minutes of Exercise per Session:  20 min  Stress: No Stress Concern Present   Feeling of Stress : Not at all  Social Connections: Moderately Isolated   Frequency of Communication with Friends and Family: Once a week   Frequency of Social Gatherings with Friends and Family: Once a week   Attends Religious Services: More than 4 times per year   Active Member of [employer] or Organizations: No   Attends Engineer, structural: Never   Marital Status: Married     Family History: The patient's family history includes Angina in his father; Breast cancer in his sister; Cancer in his sister; Dementia in his mother; Heart disease in his sister; Other in his child. ROS:   Please see the history of present illness.    All 14 point review of systems negative except as described per history of present  illness.  EKGs/Labs/Other Studies Reviewed:    The following studies were reviewed today:   EKG:  EKG is  ordered today.  The ekg ordered today demonstrates normal sinus rhythm, normal P interval, some supraventricular ectopy, no ST segment changes  Recent Labs: 07/02/2020: TSH 3.40  Recent Lipid Panel    Component Value Date/Time   CHOL 125 11/08/2017 1051   TRIG 47.0 11/08/2017 1051   HDL 54.10 11/08/2017 1051   CHOLHDL 2 11/08/2017 1051   VLDL 9.4 11/08/2017 1051   LDLCALC 62 11/08/2017 1051    Physical Exam:    VS:  BP (!) 108/56 (BP Location: Right Arm, Patient Position: Sitting)   Pulse 63   Ht 5\' 10"  (1.778 m)   Wt 148 lb (67.1 kg)   SpO2 93%   BMI 21.24 kg/m     Wt Readings from Last 3 Encounters:  04/14/21 148 lb (67.1 kg)  04/01/21 149 lb (67.6 kg)  03/12/21 154 lb 6.4 oz (70 kg)     GEN:  Well nourished, well developed in no acute distress HEENT: Normal NECK: No JVD; No carotid bruits LYMPHATICS: No lymphadenopathy CARDIAC: RRR, n diastolic murmur grade 2/6 best heard at the left border of the sternum no rubs, no gallops RESPIRATORY:  Clear to auscultation without rales, wheezing or rhonchi  ABDOMEN: Soft, non-tender, non-distended MUSCULOSKELETAL:  No edema; No deformity  SKIN: Warm and dry NEUROLOGIC:  Alert and oriented x 3 PSYCHIATRIC:  Normal affect   ASSESSMENT:    1. Orthostatic hypotension   2. PVC (premature ventricular contraction)   3. Parkinson's disease (HCC)   4. Bradycardia    PLAN:    In order of problems listed above:  Orthostatic hypotension.  Obviously this is one of the complications of Parkinson.  I discussed with his wife options for the situation I think we need to start with making sure that he is well-hydrated I asked her to give him half glass of water every hour during the day.  I will also talk about liberating salt intake.  I did recommend also to get elastic stockings if those measures will be insufficient he may  require midodrine orNorthera. Because of bradycardia and that I do not see in the physical examination I will ask him to wear Zio patch for a week.  I suspect he does have ectopy which caused his bradycardia. Parkinson disease.  Not to be scheduled to see neurologist tomorrow.  Clearly advanced with probably depression as well as dementia Diastolic heart murmur.  I suspect arctic insufficiency, echocardiogram will be done to clarify the lesion     Medication Adjustments/Labs and Tests Ordered: Current  medicines are reviewed at length with the patient today.  Concerns regarding medicines are outlined above.  No orders of the defined types were placed in this encounter.  No orders of the defined types were placed in this encounter.   Signed, Georgeanna Lea, MD, [facility]. 04/14/2021 2:49 PM    [facility]

## 2021-04-14 NOTE — Patient Instructions (Signed)
Medication Instructions:  Your physician recommends that you continue on your current medications as directed. Please refer to the Current Medication list given to you today.  *If you need a refill on your cardiac medications before your next appointment, please call your pharmacy*   Lab Work: None If you have labs (blood work) drawn today and your tests are completely normal, you will receive your results only by: MyChart Message (if you have MyChart) OR A paper copy in the mail If you have any lab test that is abnormal or we need to change your treatment, we will call you to review the results.   Testing/Procedures: Your physician has requested that you have an echocardiogram. Echocardiography is a painless test that uses sound waves to create images of your heart. It provides your doctor with information about the size and shape of your heart and how well your heart's chambers and valves are working. This procedure takes approximately one hour. There are no restrictions for this procedure.   A zio monitor was ordered today. It will remain on for 7 days. You will then return monitor and event diary in provided box. It takes 1-2 weeks for report to be downloaded and returned to us. We will call you with the results. If monitor falls off or has orange flashing light, please call Zio for further instructions.     Follow-Up: At [facility], you and your health needs are our priority.  As part of our continuing mission to provide you with exceptional heart care, we have created designated Provider Care Teams.  These Care Teams include your primary Cardiologist (physician) and Advanced Practice Providers (APPs -  Physician Assistants and Nurse Practitioners) who all work together to provide you with the care you need, when you need it.  We recommend signing up for the patient portal called "MyChart".  Sign up information is provided on this After Visit Summary.  MyChart is used to connect with  patients for Virtual Visits (Telemedicine).  Patients are able to view lab/test results, encounter notes, upcoming appointments, etc.  Non-urgent messages can be sent to your provider as well.   To learn more about what you can do with MyChart, go to [email or web address].    Your next appointment:   2 month(s)  The format for your next appointment:   In Person  Provider:   Robert Krasowski, MD   Other Instructions ZIO  WHY IS MY DOCTOR PRESCRIBING ZIO? The Zio system is proven and trusted by physicians to detect and diagnose irregular heart rhythms -- and has been prescribed to hundreds of thousands of patients.  The FDA has cleared the Zio system to monitor for many different kinds of irregular heart rhythms. In a study, physicians were able to reach a diagnosis 90% of the time with the Zio system1.  You can wear the Zio monitor -- a small, discreet, comfortable patch -- during your normal day-to-day activity, including while you sleep, shower, and exercise, while it records every single heartbeat for analysis.  1Barrett, P., et al. Comparison of 24 Hour Holter Monitoring Versus 14 Day Novel Adhesive Patch Electrocardiographic Monitoring. American Journal of Medicine, 2014.  ZIO VS. HOLTER MONITORING The Zio monitor can be comfortably worn for up to 14 days. Holter monitors can be worn for 24 to 48 hours, limiting the time to record any irregular heart rhythms you may have. Zio is able to capture data for the 51% of patients who have their first symptom-triggered arrhythmia after 48 hours.1    LIVE WITHOUT RESTRICTIONS The Zio ambulatory cardiac monitor is a small, unobtrusive, and water-resistant patch--you might even forget you're wearing it. The Zio monitor records and stores every beat of your heart, whether you're sleeping, working out, or showering. Remove on: August 16th 2022  Echocardiogram An echocardiogram is a test that uses sound waves (ultrasound) to produce images of  the heart. Images from an echocardiogram can provide important information about: Heart size and shape. The size and thickness and movement of your heart's walls. Heart muscle function and strength. Heart valve function or if you have stenosis. Stenosis is when the heart valves are too narrow. If blood is flowing backward through the heart valves (regurgitation). A tumor or infectious growth around the heart valves. Areas of heart muscle that are not working well because of poor blood flow or injury from a heart attack. Aneurysm detection. An aneurysm is a weak or damaged part of an artery wall. The wall bulges out from the normal force of blood pumping through the body. Tell a health care provider about: Any allergies you have. All medicines you are taking, including vitamins, herbs, eye drops, creams, and over-the-counter medicines. Any blood disorders you have. Any surgeries you have had. Any medical conditions you have. Whether you are pregnant or may be pregnant. What are the risks? Generally, this is a safe test. However, problems may occur, including an allergic reaction to dye (contrast) that may be used during the test. What happens before the test? No specific preparation is needed. You may eat and drink normally. What happens during the test?  You will take off your clothes from the waist up and put on a hospital gown. Electrodes or electrocardiogram (ECG)patches may be placed on your chest. The electrodes or patches are then connected to a device that monitors your heart rate and rhythm. You will lie down on a table for an ultrasound exam. A gel will be applied to your chest to help sound waves pass through your skin. A handheld device, called a transducer, will be pressed against your chest and moved over your heart. The transducer produces sound waves that travel to your heart and bounce back (or "echo" back) to the transducer. These sound waves will be captured in real-time  and changed into images of your heart that can be viewed on a video monitor. The images will be recorded on a computer and reviewed by your health care provider. You may be asked to change positions or hold your breath for a short time. This makes it easier to get different views or better views of your heart. In some cases, you may receive contrast through an IV in one of your veins. This can improve the quality of the pictures from your heart. The procedure may vary among health care providers and hospitals. What can I expect after the test? You may return to your normal, everyday life, including diet, activities, andmedicines, unless your health care provider tells you not to do that. Follow these instructions at home: It is up to you to get the results of your test. Ask your health care provider, or the department that is doing the test, when your results will be ready. Keep all follow-up visits. This is important. Summary An echocardiogram is a test that uses sound waves (ultrasound) to produce images of the heart. Images from an echocardiogram can provide important information about the size and shape of your heart, heart muscle function, heart valve function, and other possible heart problems. You   do not need to do anything to prepare before this test. You may eat and drink normally. After the echocardiogram is completed, you may return to your normal, everyday life, unless your health care provider tells you not to do that. This information is not intended to replace advice given to you by your health care provider. Make sure you discuss any questions you have with your healthcare provider. Document Revised: 04/15/2020 Document Reviewed: 04/15/2020 Elsevier Patient Education  2022 Elsevier Inc.   

## 2021-04-15 ENCOUNTER — Ambulatory Visit (INDEPENDENT_AMBULATORY_CARE_PROVIDER_SITE_OTHER): Payer: Medicare Other | Admitting: Neurology

## 2021-04-15 ENCOUNTER — Encounter: Payer: Self-pay | Admitting: Neurology

## 2021-04-15 ENCOUNTER — Telehealth: Payer: Self-pay | Admitting: Neurology

## 2021-04-15 VITALS — BP 108/58 | HR 62 | Ht 70.0 in | Wt 148.0 lb

## 2021-04-15 DIAGNOSIS — G20 Parkinson's disease: Secondary | ICD-10-CM | POA: Diagnosis not present

## 2021-04-15 DIAGNOSIS — G903 Multi-system degeneration of the autonomic nervous system: Secondary | ICD-10-CM | POA: Diagnosis not present

## 2021-04-15 MED ORDER — CARBIDOPA-LEVODOPA ER 25-100 MG PO TBCR: 1.0000 | EXTENDED_RELEASE_TABLET | Freq: Three times a day (TID) | ORAL | 1 refills | Status: DC

## 2021-04-15 MED ORDER — AMBULATORY NON FORMULARY MEDICATION: 0 refills | Status: AC

## 2021-04-15 NOTE — Patient Instructions (Signed)
Restart carbidopa/levodopa 25/100 ER (make sure that this is the extended release), 1 tablet at 8am/noon/4pm.     As a reminder, carbidopa/levodopa can be taken at the same time as a carbohydrate, but we like to have you take your pill either 30 minutes before a protein source or 1 hour after as protein can interfere with carbidopa/levodopa absorption.  2.  Use walker!  3.  Drink plenty of water and use salt!

## 2021-04-15 NOTE — Telephone Encounter (Signed)
Pt's wife called in stating she thought the updated carbidopa-levodopa prescription was being called in to [facility], but she called over there and they did not have it yet.

## 2021-04-15 NOTE — Telephone Encounter (Signed)
Sent rx as requested to [facility]. Called patients wife and informed her that the rx has been sent.

## 2021-04-22 ENCOUNTER — Ambulatory Visit (HOSPITAL_BASED_OUTPATIENT_CLINIC_OR_DEPARTMENT_OTHER)
Admission: RE | Admit: 2021-04-22 | Discharge: 2021-04-22 | Disposition: A | Discharge status: Home / Self Care | Payer: Medicare Other | Source: Ambulatory Visit | Attending: Cardiology | Admitting: Cardiology

## 2021-04-22 ENCOUNTER — Other Ambulatory Visit: Payer: Self-pay

## 2021-04-22 DIAGNOSIS — R001 Bradycardia, unspecified: Secondary | ICD-10-CM | POA: Diagnosis not present

## 2021-04-22 DIAGNOSIS — R011 Cardiac murmur, unspecified: Secondary | ICD-10-CM

## 2021-04-22 DIAGNOSIS — I493 Ventricular premature depolarization: Secondary | ICD-10-CM

## 2021-04-22 LAB — ECHOCARDIOGRAM COMPLETE
AR max vel: 0.84 cm2
AV Area VTI: 0.94 cm2
AV Area mean vel: 0.81 cm2
AV Mean grad: 6 mmHg
AV Peak grad: 10.4 mmHg
Ao pk vel: 1.61 m/s
Area-P 1/2: 2.8 cm2
S' Lateral: 2.87 cm
Single Plane A4C EF: 51.4 %

## 2021-04-22 NOTE — Progress Notes (Signed)
*  PRELIMINARY RESULTS* Echocardiogram 2D Echocardiogram has been performed.  Neomia Dear RDCS 04/22/2021, 3:05 PM

## 2021-04-23 ENCOUNTER — Ambulatory Visit: Payer: Medicare Other | Admitting: Family Medicine

## 2021-04-24 ENCOUNTER — Encounter: Payer: Self-pay | Admitting: Family Medicine

## 2021-04-24 ENCOUNTER — Ambulatory Visit (INDEPENDENT_AMBULATORY_CARE_PROVIDER_SITE_OTHER): Payer: Medicare Other | Admitting: Family Medicine

## 2021-04-24 ENCOUNTER — Other Ambulatory Visit: Payer: Self-pay

## 2021-04-24 VITALS — BP 106/64 | HR 56 | Temp 97.6°F | Ht 70.0 in | Wt 151.2 lb

## 2021-04-24 DIAGNOSIS — I959 Hypotension, unspecified: Secondary | ICD-10-CM | POA: Diagnosis not present

## 2021-04-24 DIAGNOSIS — G20 Parkinson's disease: Secondary | ICD-10-CM | POA: Diagnosis not present

## 2021-04-24 DIAGNOSIS — R001 Bradycardia, unspecified: Secondary | ICD-10-CM | POA: Diagnosis not present

## 2021-04-24 NOTE — Progress Notes (Signed)
Established Patient Office Visit  Subjective:  Patient ID: Ian Mcknight, male    DOB: 12-22-1933  Age: 87 y.o. MRN: [id number]  CC:  Chief Complaint  Patient presents with   Follow-up    6 week follow up, no concerns.     HPI Ian Mcknight presents for her follow-up.  Accompanied by his wife.  He fell a few days ago.  He has a walker at home but only uses it on occasion.  A 3 wheeled walker has been advised.  Sleeps well on occasion with occasional nocturia and enuresis.  Levodopa restarted for hopeful improvement with gait.  Recent echo cardiogram with normal left ventricular function.  Results of Zio patch are pending.  Salt load was recommended.  Wife is doing the best she can with that.  Midodrine may be added.  No past medical history on file.  Past Surgical History:  Procedure Laterality Date   EYE SURGERY Bilateral 08/07/2019   INGUINAL HERNIA REPAIR Right    PROSTATE SURGERY     TONSILLECTOMY      Family History  Problem Relation Age of Onset   Dementia Mother    Angina Father    Breast cancer Sister    Heart disease Sister    Cancer Sister    Other Child        1 with mental retardation/seizures    Social History   Socioeconomic History   Marital status: Married    Spouse name: Not on file   Number of children: Not on file   Years of education: Not on file   Highest education level: Not on file  Occupational History   Occupation: retired    Comment: builder/printer  Tobacco Use   Smoking status: Former    Types: Cigarettes    Quit date: 07/18/1969    Years since quitting: 51.8   Smokeless tobacco: Never   Tobacco comments:    light smoker  Vaping Use   Vaping Use: Never used  Substance and Sexual Activity   Alcohol use: No   Drug use: No   Sexual activity: Not on file  Other Topics Concern   Not on file  Social History Narrative   Not on file   Social Determinants of Health   Financial Resource Strain: Low Risk    Difficulty of Paying Living  Expenses: Not hard at all  Food Insecurity: No Food Insecurity   Worried About Programme researcher, broadcasting/film/video in the Last Year: Never true   Ran Out of Food in the Last Year: Never true  Transportation Needs: No Transportation Needs   Lack of Transportation (Medical): No   Lack of Transportation (Non-Medical): No  Physical Activity: Insufficiently Active   Days of Exercise per Week: 3 days   Minutes of Exercise per Session: 20 min  Stress: No Stress Concern Present   Feeling of Stress : Not at all  Social Connections: Moderately Isolated   Frequency of Communication with Friends and Family: Once a week   Frequency of Social Gatherings with Friends and Family: Once a week   Attends Religious Services: More than 4 times per year   Active Member of [employer] or Organizations: No   Attends Banker Meetings: Never   Marital Status: Married  Catering manager Violence: Not At Risk   Fear of Current or Ex-Partner: No   Emotionally Abused: No   Physically Abused: No   Sexually Abused: No    Outpatient Medications Prior to Visit  Medication Sig Dispense Refill   AMBULATORY NON FORMULARY MEDICATION Medication Name: 3 wheeled walker Dx:  G20 1 Device 0   b complex vitamins capsule Take 1 capsule by mouth daily. 100 capsule 1   Carbidopa-Levodopa ER (SINEMET CR) 25-100 MG tablet controlled release Take 1 tablet by mouth in the morning, at noon, and at bedtime. Take 1 tablet by mouth at 8 am, at noon, and at 4 pm. 270 tablet 1   docusate sodium (COLACE) 100 MG capsule Take 1 capsule (100 mg total) by mouth 2 (two) times daily. 180 capsule 4   levothyroxine (SYNTHROID) 50 MCG tablet TAKE ONE TABLET BY MOUTH DAILY BEFORE BREAKFAST (Patient taking differently: Take 50 mcg by mouth daily before breakfast.) 90 tablet 2   MULTIPLE VITAMIN PO Take 1 tablet by mouth daily. Unknown strenght     No facility-administered medications prior to visit.    Allergies  Allergen Reactions   Penicillins     Sulfa Antibiotics     ROS Review of Systems  Constitutional: Negative.   Respiratory: Negative.    Cardiovascular: Negative.   Gastrointestinal: Negative.   Psychiatric/Behavioral:  Positive for confusion and decreased concentration. Negative for agitation and behavioral problems.      Objective:    Physical Exam Vitals and nursing note reviewed.  Constitutional:      General: He is not in acute distress.    Appearance: He is ill-appearing. He is not toxic-appearing or diaphoretic.  HENT:     Head: Normocephalic.  Eyes:     General: No scleral icterus.       Left eye: No discharge.     Conjunctiva/sclera: Conjunctivae normal.  Cardiovascular:     Rate and Rhythm: Regular rhythm. Bradycardia present.  Pulmonary:     Effort: Pulmonary effort is normal.     Breath sounds: Normal breath sounds.  Neurological:     Comments: Oriented to person and place  Psychiatric:        Attention and Perception: He is inattentive.        Speech: Speech is delayed.        Behavior: Behavior is slowed.    BP 106/64 (BP Location: Left Arm, Patient Position: Sitting, Cuff Size: Normal)   Pulse (!) 56   Temp 97.6 F (36.4 C) (Temporal)   Ht 5\' 10"  (1.778 m)   Wt 151 lb 3.2 oz (68.6 kg)   SpO2 94%   BMI 21.69 kg/m  Wt Readings from Last 3 Encounters:  04/24/21 151 lb 3.2 oz (68.6 kg)  04/15/21 148 lb (67.1 kg)  04/14/21 148 lb (67.1 kg)     Health Maintenance Due  Topic Date Due   TETANUS/TDAP  Never done   Zoster Vaccines- Shingrix (1 of 2) Never done   PNA vac Low Risk Adult (1 of 2 - PCV13) Never done   INFLUENZA VACCINE  04/06/2021    There are no preventive care reminders to display for this patient.  Lab Results  Component Value Date   TSH 3.40 07/02/2020   Lab Results  Component Value Date   WBC 3.9 (L) 11/14/2018   HGB 14.6 11/14/2018   HCT 42.4 11/14/2018   MCV 93.9 11/14/2018   PLT 210.0 11/14/2018   Lab Results  Component Value Date   NA 139 11/14/2018    K 4.9 11/14/2018   CO2 30 11/14/2018   GLUCOSE 102 (H) 11/14/2018   BUN 21 11/14/2018   CREATININE 1.17 11/14/2018   BILITOT 0.6 11/08/2017  ALKPHOS 67 11/08/2017   AST 19 11/08/2017   ALT 7 11/08/2017   PROT 6.9 11/08/2017   ALBUMIN 4.1 11/08/2017   CALCIUM 9.5 11/14/2018   GFR 59.56 (L) 11/14/2018   Lab Results  Component Value Date   CHOL 125 11/08/2017   Lab Results  Component Value Date   HDL 54.10 11/08/2017   Lab Results  Component Value Date   LDLCALC 62 11/08/2017   Lab Results  Component Value Date   TRIG 47.0 11/08/2017   Lab Results  Component Value Date   CHOLHDL 2 11/08/2017   No results found for: HGBA1C    Assessment & Plan:   Problem List Items Addressed This Visit       Cardiovascular and Mediastinum   Hypotension - Primary     Nervous and Auditory   Parkinson's disease (HCC)    No orders of the defined types were placed in this encounter.   Follow-up: Return in about 3 months (around 07/25/2021), or Use 3 wheel walker at all times. Continue added salt and possible additon of midodrine..  Avoid fluids 2 hours before bedtime.   Mliss Sax, MD

## 2021-05-01 ENCOUNTER — Telehealth: Payer: Self-pay

## 2021-05-01 NOTE — Telephone Encounter (Signed)
Ok per [employer], spoke with Mrs. Monia Pouch, she's ben notified of results

## 2021-05-01 NOTE — Telephone Encounter (Signed)
-----   Message from Georgeanna Lea, MD sent at 04/27/2021 10:34 AM EDT ----- Echocardiogram showed preserved left ventricle ejection fraction, mild mitral valve regurgitation overall looks good

## 2021-05-13 ENCOUNTER — Telehealth: Payer: Self-pay

## 2021-05-13 NOTE — Telephone Encounter (Signed)
Left message on patients voicemail to please return our call.   

## 2021-05-13 NOTE — Telephone Encounter (Signed)
-----   Message from Robert J Krasowski, MD sent at 05/13/2021 12:23 PM EDT ----- Monitor showed 6 episode of ventricular tachycardia, he is echocardiogram showed preserved left ventricle ejection fraction.  We will talk about options during the next visit 

## 2021-05-14 ENCOUNTER — Telehealth: Payer: Self-pay

## 2021-05-14 NOTE — Telephone Encounter (Signed)
Spoke with patient regarding results and recommendation.  Patient verbalizes understanding and is agreeable to plan of care. Advised patient to call back with any issues or concerns.  

## 2021-05-14 NOTE — Telephone Encounter (Signed)
-----   Message from Georgeanna Lea, MD sent at 05/13/2021 12:23 PM EDT ----- Monitor showed 6 episode of ventricular tachycardia, he is echocardiogram showed preserved left ventricle ejection fraction.  We will talk about options during the next visit

## 2021-05-21 ENCOUNTER — Other Ambulatory Visit: Payer: Self-pay | Admitting: Family Medicine

## 2021-05-21 DIAGNOSIS — E039 Hypothyroidism, unspecified: Secondary | ICD-10-CM

## 2021-05-27 ENCOUNTER — Encounter: Payer: Self-pay | Admitting: Family Medicine

## 2021-05-27 ENCOUNTER — Ambulatory Visit (INDEPENDENT_AMBULATORY_CARE_PROVIDER_SITE_OTHER): Payer: Medicare Other | Admitting: Family Medicine

## 2021-05-27 ENCOUNTER — Other Ambulatory Visit: Payer: Self-pay

## 2021-05-27 VITALS — BP 100/62 | HR 73 | Temp 98.1°F | Ht 70.0 in | Wt 143.6 lb

## 2021-05-27 DIAGNOSIS — I959 Hypotension, unspecified: Secondary | ICD-10-CM | POA: Diagnosis not present

## 2021-05-27 DIAGNOSIS — E860 Dehydration: Secondary | ICD-10-CM

## 2021-05-27 DIAGNOSIS — G20 Parkinson's disease: Secondary | ICD-10-CM

## 2021-05-27 DIAGNOSIS — R531 Weakness: Secondary | ICD-10-CM

## 2021-05-27 LAB — URINALYSIS, ROUTINE W REFLEX MICROSCOPIC
Ketones, ur: NEGATIVE
Leukocytes,Ua: NEGATIVE
Nitrite: NEGATIVE
Specific Gravity, Urine: >1.03 — AB (ref 1.000–1.030)
Total Protein, Urine: NEGATIVE
Urine Glucose: NEGATIVE
Urobilinogen, UA: 0.2 — AB (ref 0.0–1.0)
pH: 5.5 (ref 5.0–8.0)

## 2021-05-27 LAB — COMPREHENSIVE METABOLIC PANEL
ALT: 21 U/L (ref 0–53)
AST: 35 U/L (ref 0–37)
Albumin: 3.9 g/dL (ref 3.5–5.2)
Alkaline Phosphatase: 65 U/L (ref 39–117)
BUN: 27 mg/dL — ABNORMAL HIGH (ref 6–23)
CO2: 29 mEq/L (ref 19–32)
Calcium: 9.4 mg/dL (ref 8.4–10.5)
Chloride: 104 mEq/L (ref 96–112)
Creatinine, Ser: 0.94 mg/dL (ref 0.40–1.50)
GFR: 73.99 mL/min (ref >60.00)
Glucose, Bld: 93 mg/dL (ref 70–99)
Potassium: 4.1 mEq/L (ref 3.5–5.1)
Sodium: 140 mEq/L (ref 135–145)
Total Bilirubin: 0.9 mg/dL (ref 0.2–1.2)
Total Protein: 7.1 g/dL (ref 6.0–8.3)

## 2021-05-27 LAB — CBC
HCT: 37.5 % — ABNORMAL LOW (ref 39.0–52.0)
Hemoglobin: 12.7 g/dL — ABNORMAL LOW (ref 13.0–17.0)
MCHC: 33.7 g/dL (ref 30.0–36.0)
MCV: 93.6 fl (ref 78.0–100.0)
Platelets: 205 10*3/uL (ref 150.0–400.0)
RBC: 4.01 Mil/uL — ABNORMAL LOW (ref 4.22–5.81)
RDW: 12.4 % (ref 11.5–15.5)
WBC: 5.7 10*3/uL (ref 4.0–10.5)

## 2021-05-27 NOTE — Progress Notes (Signed)
Established Patient Office Visit  Subjective:  Patient ID: Ian Mcknight, male    DOB: 08-22-1934  Age: 87 y.o. MRN: [id number]  CC:  Chief Complaint  Patient presents with   Fall    Follow up on fall few weeks ago, seen at ED when fall occurred.     HPI Ian Mcknight presents for emergency room discharge follow-up after follow-up few weeks ago.  He was walking up the steps and fell backwards when he got up to the top staff.  Emergency room note was reviewed.  Radiology studies were reviewed.  He is back at home under their care of his elderly wife.  He is needing assistance with toileting, clothing and eating.  He is able to ambulate some within the house with a walker.  He uses a wheelchair at other times.  He has been refusing to take his Sinemet.  Wife says that he has been eating but not drinking fluids.  No past medical history on file.  Past Surgical History:  Procedure Laterality Date   EYE SURGERY Bilateral 08/07/2019   INGUINAL HERNIA REPAIR Right    PROSTATE SURGERY     TONSILLECTOMY      Family History  Problem Relation Age of Onset   Dementia Mother    Angina Father    Breast cancer Sister    Heart disease Sister    Cancer Sister    Other Child        1 with mental retardation/seizures    Social History   Socioeconomic History   Marital status: Married    Spouse name: Not on file   Number of children: Not on file   Years of education: Not on file   Highest education level: Not on file  Occupational History   Occupation: retired    Comment: builder/printer  Tobacco Use   Smoking status: Former    Types: Cigarettes    Quit date: 07/18/1969    Years since quitting: 51.8   Smokeless tobacco: Never   Tobacco comments:    light smoker  Vaping Use   Vaping Use: Never used  Substance and Sexual Activity   Alcohol use: No   Drug use: No   Sexual activity: Not on file  Other Topics Concern   Not on file  Social History Narrative   Not on file   Social  Determinants of Health   Financial Resource Strain: Low Risk    Difficulty of Paying Living Expenses: Not hard at all  Food Insecurity: No Food Insecurity   Worried About Programme researcher, broadcasting/film/video in the Last Year: Never true   Ran Out of Food in the Last Year: Never true  Transportation Needs: No Transportation Needs   Lack of Transportation (Medical): No   Lack of Transportation (Non-Medical): No  Physical Activity: Insufficiently Active   Days of Exercise per Week: 3 days   Minutes of Exercise per Session: 20 min  Stress: No Stress Concern Present   Feeling of Stress : Not at all  Social Connections: Moderately Isolated   Frequency of Communication with Friends and Family: Once a week   Frequency of Social Gatherings with Friends and Family: Once a week   Attends Religious Services: More than 4 times per year   Active Member of [employer] or Organizations: No   Attends Banker Meetings: Never   Marital Status: Married  Catering manager Violence: Not At Risk   Fear of Current or Ex-Partner: No   Emotionally  Abused: No   Physically Abused: No   Sexually Abused: No    Outpatient Medications Prior to Visit  Medication Sig Dispense Refill   AMBULATORY NON FORMULARY MEDICATION Medication Name: 3 wheeled walker Dx:  G20 1 Device 0   b complex vitamins capsule Take 1 capsule by mouth daily. 100 capsule 1   Carbidopa-Levodopa ER (SINEMET CR) 25-100 MG tablet controlled release Take 1 tablet by mouth in the morning, at noon, and at bedtime. Take 1 tablet by mouth at 8 am, at noon, and at 4 pm. 270 tablet 1   docusate sodium (COLACE) 100 MG capsule Take 1 capsule (100 mg total) by mouth 2 (two) times daily. 180 capsule 4   levothyroxine (SYNTHROID) 50 MCG tablet TAKE ONE TABLET BY MOUTH DAILY BEFORE BREAKFAST 90 tablet 2   MULTIPLE VITAMIN PO Take 1 tablet by mouth daily. Unknown strenght     No facility-administered medications prior to visit.    Allergies  Allergen Reactions    Penicillins    Sulfa Antibiotics     ROS Review of Systems  Constitutional:  Positive for activity change and fatigue. Negative for diaphoresis.  Respiratory: Negative.    Cardiovascular: Negative.   Gastrointestinal: Negative.   Neurological:  Positive for weakness.  Psychiatric/Behavioral:  Negative for agitation, behavioral problems, dysphoric mood and hallucinations.      Objective:    Physical Exam Vitals and nursing note reviewed.  Constitutional:      General: He is not in acute distress.    Appearance: He is ill-appearing. He is not toxic-appearing or diaphoretic.  HENT:     Head: Normocephalic and atraumatic.     Right Ear: External ear normal.     Left Ear: External ear normal.  Cardiovascular:     Rate and Rhythm: Normal rate and regular rhythm.  Pulmonary:     Effort: Pulmonary effort is normal.     Breath sounds: Normal breath sounds.  Skin:    General: Skin is warm and dry.  Neurological:     Mental Status: He is lethargic.     Comments: Was oriented to person place and time.  He did not know who I was.  He was not participating in the subjective part of the exam.  His wife was answering all the questions.    BP 100/62 (BP Location: Left Arm, Patient Position: Sitting, Cuff Size: Normal)   Pulse 73   Temp 98.1 F (36.7 C) (Temporal)   Ht 5\' 10"  (1.778 m)   Wt 143 lb 9.6 oz (65.1 kg)   SpO2 97%   BMI 20.60 kg/m  Wt Readings from Last 3 Encounters:  05/27/21 143 lb 9.6 oz (65.1 kg)  04/24/21 151 lb 3.2 oz (68.6 kg)  04/15/21 148 lb (67.1 kg)     Health Maintenance Due  Topic Date Due   TETANUS/TDAP  Never done   Zoster Vaccines- Shingrix (1 of 2) Never done    There are no preventive care reminders to display for this patient.  Lab Results  Component Value Date   TSH 3.40 07/02/2020   Lab Results  Component Value Date   WBC 3.9 (L) 11/14/2018   HGB 14.6 11/14/2018   HCT 42.4 11/14/2018   MCV 93.9 11/14/2018   PLT 210.0 11/14/2018    Lab Results  Component Value Date   NA 139 11/14/2018   K 4.9 11/14/2018   CO2 30 11/14/2018   GLUCOSE 102 (H) 11/14/2018   BUN 21 11/14/2018  CREATININE 1.17 11/14/2018   BILITOT 0.6 11/08/2017   ALKPHOS 67 11/08/2017   AST 19 11/08/2017   ALT 7 11/08/2017   PROT 6.9 11/08/2017   ALBUMIN 4.1 11/08/2017   CALCIUM 9.5 11/14/2018   GFR 59.56 (L) 11/14/2018   Lab Results  Component Value Date   CHOL 125 11/08/2017   Lab Results  Component Value Date   HDL 54.10 11/08/2017   Lab Results  Component Value Date   LDLCALC 62 11/08/2017   Lab Results  Component Value Date   TRIG 47.0 11/08/2017   Lab Results  Component Value Date   CHOLHDL 2 11/08/2017   No results found for: HGBA1C    Assessment & Plan:   Problem List Items Addressed This Visit       Cardiovascular and Mediastinum   Hypotension - Primary   Relevant Orders   CBC   Urinalysis, Routine w reflex microscopic     Nervous and Auditory   Parkinson's disease (HCC)   Relevant Orders   Ambulatory referral to Home Health     Other   Weakness generalized   Relevant Orders   Ambulatory referral to Home Health   CBC   Urinalysis, Routine w reflex microscopic   Comprehensive metabolic panel   Prealbumin   Dehydration   Relevant Orders   Urinalysis, Routine w reflex microscopic    No orders of the defined types were placed in this encounter.   Follow-up: Return in about 1 month (around 06/26/2021).   Have asked for visiting home health evaluation.  Follow-up in 1 month.  May consider therapy for depression.  I actually agree with his discontinuation of the sentiment.  Believe that it is contributing to his hypotension.  Encouraged increased hydration and ambulation with a walker. Mliss Sax, MD

## 2021-05-28 LAB — PREALBUMIN: Prealbumin: 14 mg/dL — ABNORMAL LOW (ref 21–43)

## 2021-05-31 ENCOUNTER — Other Ambulatory Visit: Payer: Self-pay | Admitting: Neurology

## 2021-06-01 ENCOUNTER — Other Ambulatory Visit: Payer: Self-pay

## 2021-06-01 DIAGNOSIS — G20 Parkinson's disease: Secondary | ICD-10-CM

## 2021-06-01 MED ORDER — CARBIDOPA-LEVODOPA ER 25-100 MG PO TBCR: 1.0000 | EXTENDED_RELEASE_TABLET | Freq: Three times a day (TID) | ORAL | 1 refills | Status: DC

## 2021-06-01 MED ORDER — CARBIDOPA-LEVODOPA ER 25-100 MG PO TBCR
1.0000 | EXTENDED_RELEASE_TABLET | Freq: Three times a day (TID) | ORAL | 1 refills | Status: AC
Start: 2021-06-01 — End: ?

## 2021-06-18 ENCOUNTER — Telehealth: Payer: Self-pay | Admitting: Family Medicine

## 2021-06-18 NOTE — Telephone Encounter (Signed)
Pt son called requesting to speak with Dr Doreene Burke or Brendia Sacks... needing FLT Form filled out to check father into a memory care facility and would like to discuss...   Son, Arlys John, is not on the DPR that I can see but his number is [phone]

## 2021-06-19 NOTE — Telephone Encounter (Signed)
Returned call no answer LMTCB 

## 2021-06-19 NOTE — Telephone Encounter (Signed)
Spoke with patient son who verbally understood that they will need to find a facility that they would like for patient to go, then have the facility to fax over forms to be filled out. Brain states that they have a few more places to visit then they will decide and let us know.

## 2021-06-23 ENCOUNTER — Ambulatory Visit: Payer: Medicare Other | Admitting: Cardiology

## 2021-06-29 ENCOUNTER — Ambulatory Visit: Payer: Medicare Other | Admitting: Family Medicine

## 2021-07-27 ENCOUNTER — Ambulatory Visit: Payer: Medicare Other | Admitting: Family Medicine

## 2021-09-06 DEATH — deceased

## 2021-09-16 NOTE — Progress Notes (Deleted)
Assessment/Plan:   1.  Parkinsons Disease  -I think that the main issue last visit in terms of more trouble walking was that he was off of levodopa (still is)  -restart levodopa.  Will use carbidopa/levodopa 25/100 CR, 1 po tid.  Monitor BP as its already low  -discussed with patient when he is not in [facility] and he is up walking, he needs to use walker at all times.  He has small doorways and needs smaller walker.  Discussed that 3 wheeled walker may be smaller   2.  Depression  -Primary care treating.  Patient given several medications but has not taken them.  3.  Nocturia  -Following with urology.  4.  Parkinsons Disease hallucinations  -Still having these off of levodopa, but are somewhat better.  We will consider Nuplazid in the future.  5.  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  Going to send cardiologist a message about his BP today, in context that I would like to start levodopa back and probably will need midodrine ( ADDENDUM:  spoke with cardiologist and he wants to hold on midodrine and try to increase water/liberalize salt first - discussed those things with patient yesterday as well)  -awaiting zio patch for bradycardia (by pt hx)      Subjective:   Ian Mcknight was seen today in follow up for Parkinsons disease.  My previous records were reviewed prior to todays visit as well as outside records available to me.  Last visit, I restarted the patients levodopa.  I told them to watch very closely the blood pressure.  I also contacted the cardiologist, as I thought that the patient would likely need midodrine, but the cardiologist stated that he wanted to hold on the midodrine and liberalize water and salt first.  Multiple ER visits since our last visit.  He fell on September 15 and was in the emergency room at [facility].  Patient suffered a small subdural hematoma (2 mm).  Patient back in the emergency room October 5 after a fall  attempting to get out of bed.  Intracranially, CT was negative/nonacute.  There was apparently a centrally calcified subcutaneous soft tissue lesion of the left and right posterior scalp.  Patient admitted to [facility] October 17 as patient had worsening memory and wife was unable to care for him at home.  Admission indicated that patient had slurred speech and oriented to person only (very different from our examination in the past).  CT brain again done and intracranially, it was again negative, but it was felt that a dental infection in one of the right molars could not be excluded.  Noted that blood pressures while hospitalized continued to be fairly low (102/58, 97/57, 116/84).  Patient back in the hospital for admission on November 30 for failure to thrive.  Speech therapy saw the patient and recommended PEG tube placement, but family declined.  Patient felt to be high risk for aspiration.  Family opted for hospice care at nursing facility.   Current prescribed movement disorder medications: Carbidopa/levodopa 25/100 CR, 1 tablet 3 times per day     PREVIOUS MEDICATIONS: Lexapro (worsened depression potentially, although primary care notes indicate that he never took the medicine); carbidopa/levodopa 25/100 IR (changed to CR because of sleepiness  ALLERGIES:   Allergies  Allergen Reactions   Penicillins    Sulfa Antibiotics     CURRENT MEDICATIONS:  Outpatient Encounter Medications as of 09/17/2021  Medication Sig  AMBULATORY NON FORMULARY MEDICATION Medication Name: 3 wheeled walker Dx:  G20   b complex vitamins capsule Take 1 capsule by mouth daily.   carbidopa-levodopa (SINEMET IR) 25-100 MG tablet TAKE ONE TABLET BY MOUTH THREE TIMES A DAY   Carbidopa-Levodopa ER (SINEMET CR) 25-100 MG tablet controlled release Take 1 tablet by mouth in the morning, at noon, and at bedtime. Take 1 tablet by mouth at 8 am, at noon, and at 4 pm.   docusate sodium (COLACE) 100 MG capsule  Take 1 capsule (100 mg total) by mouth 2 (two) times daily.   levothyroxine (SYNTHROID) 50 MCG tablet TAKE ONE TABLET BY MOUTH DAILY BEFORE BREAKFAST   MULTIPLE VITAMIN PO Take 1 tablet by mouth daily. Unknown strenght   No facility-administered encounter medications on file as of 09/17/2021.    Objective:   PHYSICAL EXAMINATION:    VITALS:   There were no vitals filed for this visit.   No data found.     GEN:  The patient appears stated age and is in NAD. HEENT:  Normocephalic, atraumatic.  The mucous membranes are moist. The superficial temporal arteries are without ropiness or tenderness. CV:  RRR Lungs:  CTAB Neck/HEME:  There are no carotid bruits bilaterally.  Neurological examination:  Orientation: The patient is alert and oriented x3.  He is able to answer questions appropriately, although he is slow with answers. Cranial nerves: There is good facial symmetry with facial hypomimia. The speech is fluent and hypophonic and markedly dysarthric. Soft palate rises symmetrically and there is no tongue deviation. Hearing is intact to conversational tone. Sensation: Sensation is intact to light touch throughout Motor: Strength is at least antigravity x4.  Movement examination: Tone: There is slight increased tone in the bilateral UE. Abnormal movements: none Coordination:  There is slowness of RAMs Gait and Station: The patient is in a transport chair.  He uses the examiner as a walker. He requires assist out of the chair.  He is slow and unstable.  He freezes.  He turns en bloc  I have reviewed and interpreted the following labs independently    Chemistry      Component Value Date/Time   NA 140 05/27/2021 1211   K 4.1 05/27/2021 1211   CL 104 05/27/2021 1211   CO2 29 05/27/2021 1211   BUN 27 (H) 05/27/2021 1211   CREATININE 0.94 05/27/2021 1211      Component Value Date/Time   CALCIUM 9.4 05/27/2021 1211   ALKPHOS 65 05/27/2021 1211   AST 35 05/27/2021 1211    ALT 21 05/27/2021 1211   BILITOT 0.9 05/27/2021 1211       Lab Results  Component Value Date   WBC 5.7 05/27/2021   HGB 12.7 (L) 05/27/2021   HCT 37.5 (L) 05/27/2021   MCV 93.6 05/27/2021   PLT 205.0 05/27/2021    Lab Results  Component Value Date   TSH 3.40 07/02/2020     Total time spent on today's visit was *** minutes, including both face-to-face time and nonface-to-face time.  Time included that spent on review of records (prior notes available to me/labs/imaging if pertinent), discussing treatment and goals, answering patient's questions and coordinating care.  Cc:  Mliss Sax, MD

## 2021-09-17 ENCOUNTER — Ambulatory Visit: Payer: Medicare Other | Admitting: Neurology

## 2022-05-22 IMAGING — MR MR HEAD W/O CM
10 of 11 series · 36 of 48 positions shown · non-contrast
Comparison: Head CT 01/14/2021

CLINICAL DATA: Dysarthria and left-sided weakness

EXAM:
MRI HEAD WITHOUT CONTRAST
TECHNIQUE: Multiplanar, multiecho pulse sequences of the brain and surrounding
structures were obtained without intravenous contrast.

[Series 2: DWI · axial · 3.0mm · 2.00mm/px · z∈[-37,+134]mm · 10 of 114 slices shown (1 of 4)]
[im 1/114]
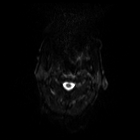
[im 13/114]
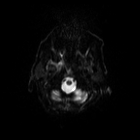
[im 26/114]
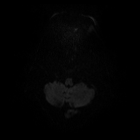
[im 38/114]
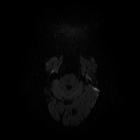
[im 51/114]
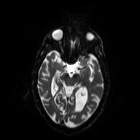
[im 63/114]
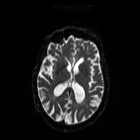
[im 76/114]
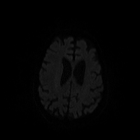
[im 88/114]
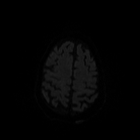
[im 101/114]
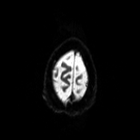
[im 114/114]
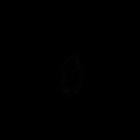

[Series 3: DWI · axial · 3.0mm · 2.00mm/px · z∈[-37,+134]mm · 5 of 58 slices shown (2 of 4)]
[im 1/58]
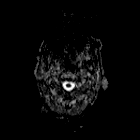
[im 15/58]
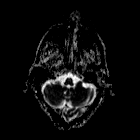
[im 29/58]
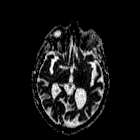
[im 43/58]
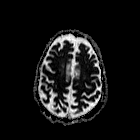
[im 58/58]
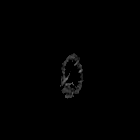

[Series 4: DWI · coronal · 5.0mm · 1.46mm/px · 6 of 84 slices shown (3 of 4)]
[im 1/84]
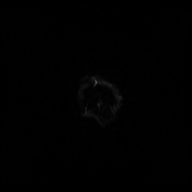
[im 17/84]
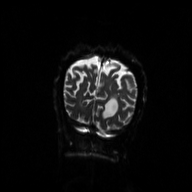
[im 34/84]
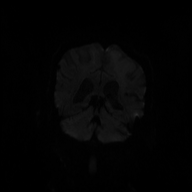
[im 50/84]
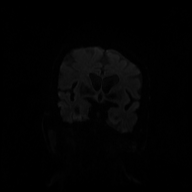
[im 67/84]
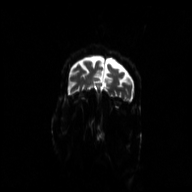
[im 84/84]
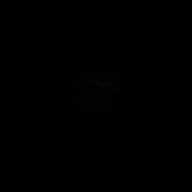

[Series 5: DWI · coronal · 5.0mm · 1.46mm/px · 3 of 42 slices shown (4 of 4)]
[im 1/42]
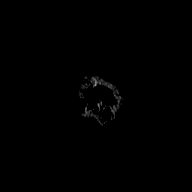
[im 21/42]
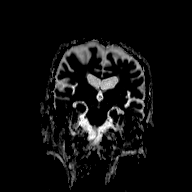
[im 42/42]
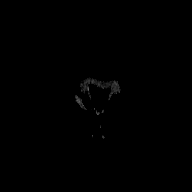

[Series 6: T1 · sagittal · 5.0mm · 0.49mm/px · 2 of 23 slices shown]
[im 1/23]
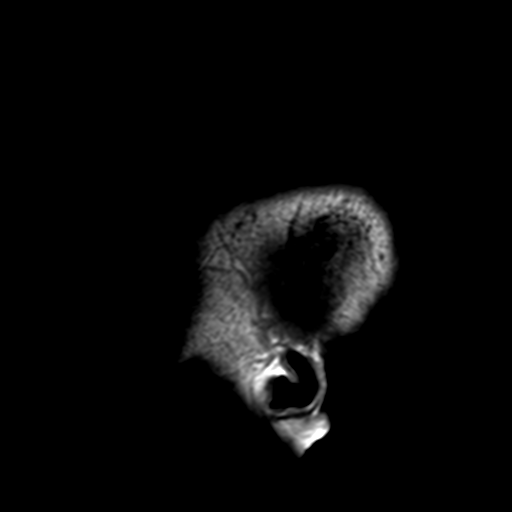
[im 23/23]
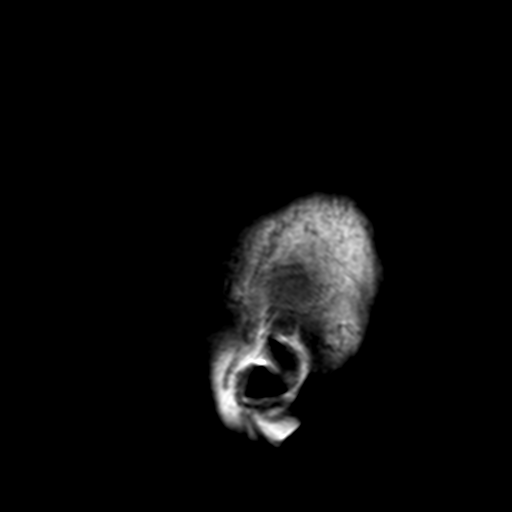

[Series 7: T2 · axial · 5.0mm · 0.38mm/px · z∈[-25,+141]mm · 2 of 25 slices shown (1 of 2)]
[im 1/25]
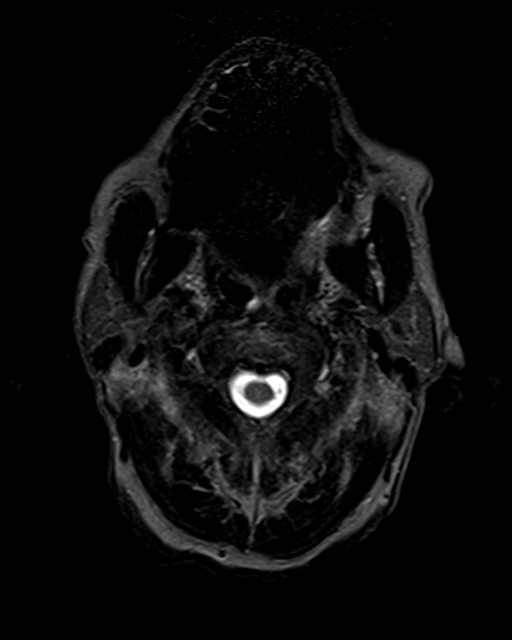
[im 25/25]
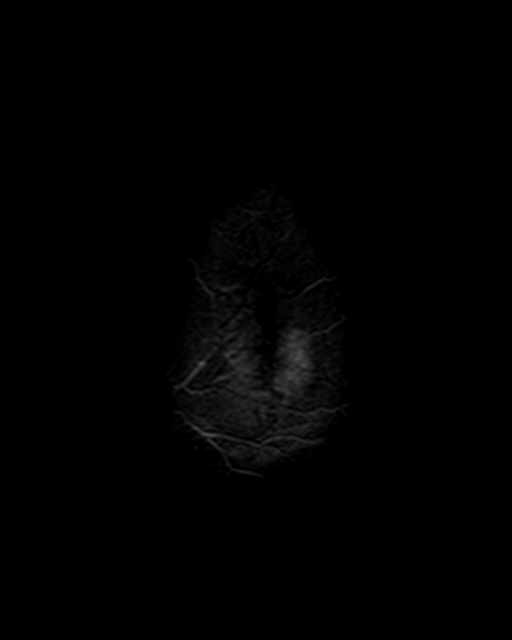

[Series 8: T2 · axial · 5.0mm · 0.47mm/px · z∈[-26,+141]mm · 2 of 25 slices shown (2 of 2)]
[im 1/25]
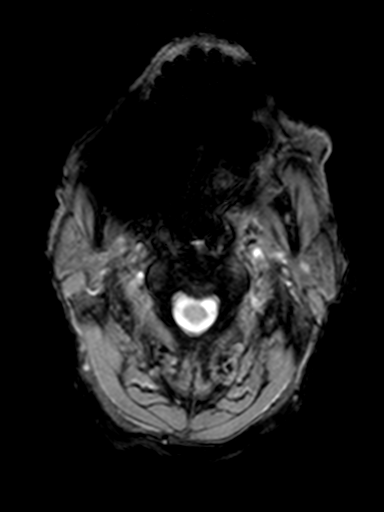
[im 25/25]
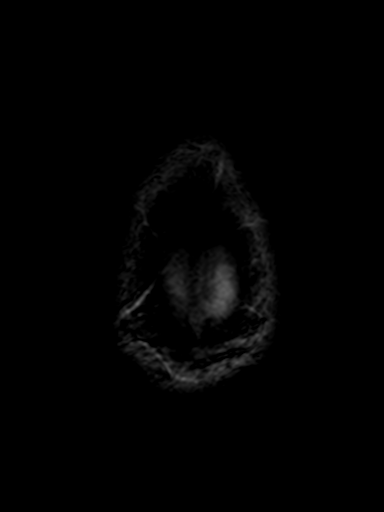

[Series 9: FLAIR · axial · 3.0mm · 0.38mm/px · z∈[-29,+145]mm · 3 of 45 slices shown]
[im 1/45]
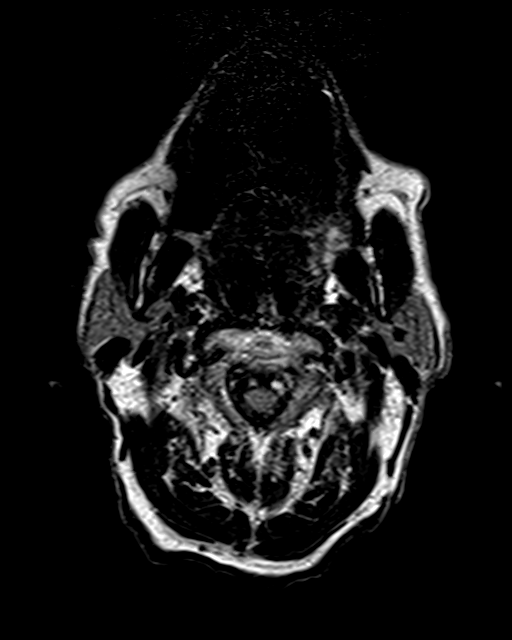
[im 23/45]
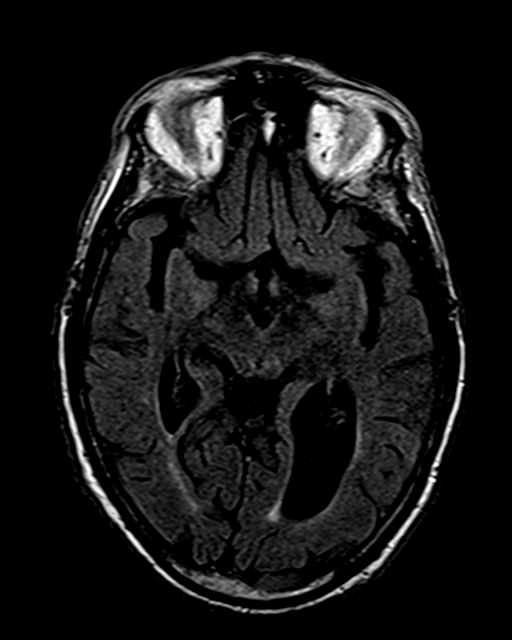
[im 45/45]
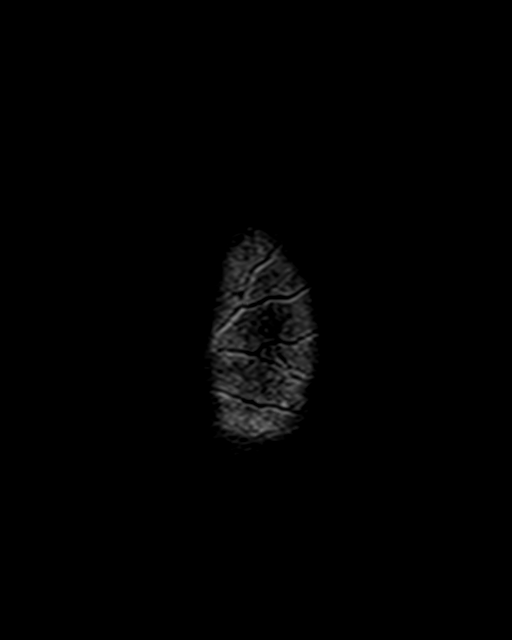

[Series 11: T2 post-contrast · coronal · 5.0mm · 0.72mm/px · 2 of 32 slices shown]
[im 1/32]
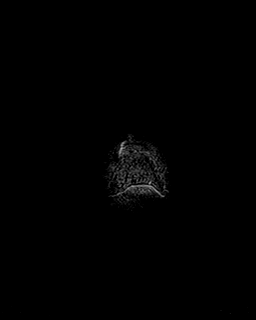
[im 32/32]
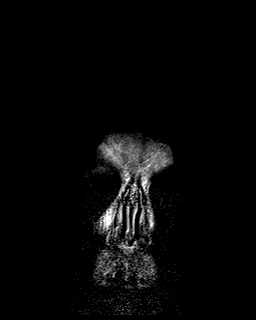

[Series 100: hx · axial · 10.0mm · 0.55mm/px · 1 of 13 slices shown]
[im 1/13]
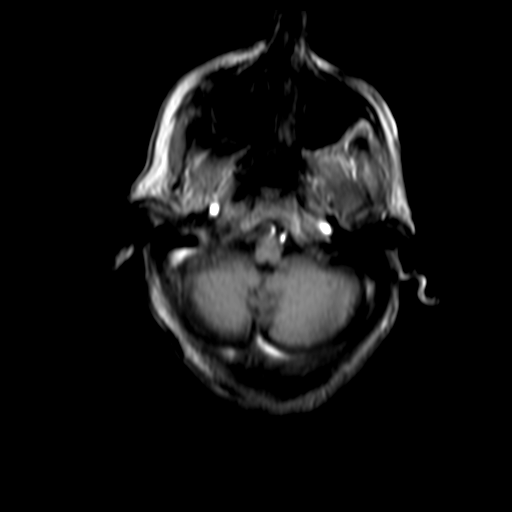

[36 of 48 positions shown; findings below may reference images not displayed]

FINDINGS: Brain: Cerebral volume loss without specific pattern, moderate. Age
typical small-vessel ischemic type change in the deep white matter.
No cortical infarct, hemorrhage, hydrocephalus, mass, or collection.

Vascular: Normal flow voids

Skull and upper cervical spine: Normal marrow signal

Sinuses/Orbits: Bilateral cataract resection.

Other: Dermal inclusion cyst appearance in the left parietal and
right occipital scalp.
IMPRESSION: Aging brain without specific or focal insult to correlate with
symptoms. No reversible findings.
